# Patient Record
Sex: Male | Born: 1965 | Race: White | Hispanic: No | Marital: Single | Smoking: Current every day smoker
Health system: Southern US, Community
[De-identification: ages and names within clinical notes are randomized; demographics above are authoritative.]

## PROBLEM LIST (undated history)

## (undated) ENCOUNTER — Emergency Department (HOSPITAL_COMMUNITY): Admission: EM | Payer: Self-pay | Source: Home / Self Care

## (undated) DIAGNOSIS — R229 Localized swelling, mass and lump, unspecified: Secondary | ICD-10-CM

## (undated) DIAGNOSIS — K219 Gastro-esophageal reflux disease without esophagitis: Secondary | ICD-10-CM

## (undated) DIAGNOSIS — F32A Depression, unspecified: Secondary | ICD-10-CM

## (undated) DIAGNOSIS — M1990 Unspecified osteoarthritis, unspecified site: Secondary | ICD-10-CM

## (undated) DIAGNOSIS — F419 Anxiety disorder, unspecified: Secondary | ICD-10-CM

## (undated) DIAGNOSIS — IMO0002 Reserved for concepts with insufficient information to code with codable children: Secondary | ICD-10-CM

## (undated) DIAGNOSIS — F329 Major depressive disorder, single episode, unspecified: Secondary | ICD-10-CM

## (undated) DIAGNOSIS — S20419A Abrasion of unspecified back wall of thorax, initial encounter: Secondary | ICD-10-CM

## (undated) HISTORY — DX: Depression, unspecified: F32.A

## (undated) HISTORY — DX: Gastro-esophageal reflux disease without esophagitis: K21.9

## (undated) HISTORY — DX: Anxiety disorder, unspecified: F41.9

## (undated) HISTORY — DX: Major depressive disorder, single episode, unspecified: F32.9

---

## 2002-01-09 ENCOUNTER — Emergency Department (HOSPITAL_COMMUNITY): Admission: EM | Admit: 2002-01-09 | Discharge: 2002-01-10 | Payer: Self-pay | Admitting: Emergency Medicine

## 2002-01-10 ENCOUNTER — Emergency Department (HOSPITAL_COMMUNITY): Admission: EM | Admit: 2002-01-10 | Discharge: 2002-01-10 | Payer: Self-pay | Admitting: *Deleted

## 2002-01-10 ENCOUNTER — Encounter: Payer: Self-pay | Admitting: Emergency Medicine

## 2002-01-14 ENCOUNTER — Emergency Department (HOSPITAL_COMMUNITY): Admission: EM | Admit: 2002-01-14 | Discharge: 2002-01-14 | Payer: Self-pay | Admitting: Emergency Medicine

## 2002-01-23 ENCOUNTER — Emergency Department (HOSPITAL_COMMUNITY): Admission: EM | Admit: 2002-01-23 | Discharge: 2002-01-23 | Payer: Self-pay | Admitting: Emergency Medicine

## 2004-03-02 ENCOUNTER — Emergency Department (HOSPITAL_COMMUNITY): Admission: EM | Admit: 2004-03-02 | Discharge: 2004-03-02 | Payer: Self-pay | Admitting: Emergency Medicine

## 2005-02-02 ENCOUNTER — Emergency Department (HOSPITAL_COMMUNITY): Admission: EM | Admit: 2005-02-02 | Discharge: 2005-02-02 | Payer: Self-pay | Admitting: Emergency Medicine

## 2005-09-29 ENCOUNTER — Emergency Department (HOSPITAL_COMMUNITY): Admission: EM | Admit: 2005-09-29 | Discharge: 2005-09-29 | Payer: Self-pay | Admitting: Emergency Medicine

## 2006-04-02 ENCOUNTER — Emergency Department (HOSPITAL_COMMUNITY): Admission: EM | Admit: 2006-04-02 | Discharge: 2006-04-02 | Payer: Self-pay | Admitting: Emergency Medicine

## 2008-02-19 ENCOUNTER — Emergency Department (HOSPITAL_COMMUNITY): Admission: EM | Admit: 2008-02-19 | Discharge: 2008-02-20 | Payer: Self-pay | Admitting: Emergency Medicine

## 2009-02-02 ENCOUNTER — Emergency Department (HOSPITAL_COMMUNITY): Admission: EM | Admit: 2009-02-02 | Discharge: 2009-02-03 | Payer: Self-pay | Admitting: Emergency Medicine

## 2010-07-25 LAB — ACETAMINOPHEN LEVEL: Acetaminophen (Tylenol), Serum: 18.2 ug/mL (ref 10–30)

## 2010-07-25 LAB — COMPREHENSIVE METABOLIC PANEL
ALT: 16 U/L (ref 0–53)
CO2: 27 mEq/L (ref 19–32)
Calcium: 8.9 mg/dL (ref 8.4–10.5)
Creatinine, Ser: 0.82 mg/dL (ref 0.4–1.5)
GFR calc non Af Amer: >60 mL/min (ref >60)
Glucose, Bld: 76 mg/dL (ref 70–99)

## 2010-07-25 LAB — DIFFERENTIAL
Basophils Absolute: 0.1 10*3/uL (ref 0.0–0.1)
Basophils Relative: 1 % (ref 0–1)
Lymphocytes Relative: 39 % (ref 12–46)
Monocytes Absolute: 0.5 10*3/uL (ref 0.1–1.0)
Neutro Abs: 3.4 10*3/uL (ref 1.7–7.7)
Neutrophils Relative %: 48 % (ref 43–77)

## 2010-07-25 LAB — RAPID URINE DRUG SCREEN, HOSP PERFORMED
Cocaine: POSITIVE — AB
Opiates: POSITIVE — AB

## 2010-07-25 LAB — CBC
HCT: 33.3 % — ABNORMAL LOW (ref 39.0–52.0)
Hemoglobin: 11.4 g/dL — ABNORMAL LOW (ref 13.0–17.0)
MCHC: 34.4 g/dL (ref 30.0–36.0)
MCV: 92 fL (ref 78.0–100.0)
RBC: 3.62 MIL/uL — ABNORMAL LOW (ref 4.22–5.81)

## 2010-07-25 LAB — SALICYLATE LEVEL: Salicylate Lvl: <4 mg/dL (ref 2.8–20.0)

## 2014-01-05 ENCOUNTER — Emergency Department (HOSPITAL_COMMUNITY)
Admission: EM | Admit: 2014-01-05 | Discharge: 2014-01-06 | Disposition: A | Discharge status: Home / Self Care | Payer: Self-pay | Attending: Emergency Medicine | Admitting: Emergency Medicine

## 2014-01-05 DIAGNOSIS — Z046 Encounter for general psychiatric examination, requested by authority: Secondary | ICD-10-CM | POA: Insufficient documentation

## 2014-01-05 DIAGNOSIS — F1910 Other psychoactive substance abuse, uncomplicated: Secondary | ICD-10-CM

## 2014-01-05 DIAGNOSIS — F1310 Sedative, hypnotic or anxiolytic abuse, uncomplicated: Secondary | ICD-10-CM | POA: Insufficient documentation

## 2014-01-05 HISTORY — DX: Abrasion of unspecified back wall of thorax, initial encounter: S20.419A
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
## 2014-01-05 NOTE — ED Provider Notes (Signed)
CSN: [id number]     Arrival date & time 01/05/14  2348 History  This chart was scribed for No att. providers found by Tonye Royalty, ED Scribe. This patient was seen in room Room/bed info not found and the patient's care was started at 11:52 PM.    No chief complaint on file.  Patient is a 48 y.o. male presenting with altered mental status. The history is provided by the police (the police brought the pt to the er because he was confused and lethargic). No language interpreter was used.  Altered Mental Status Presenting symptoms: lethargy   Severity:  Moderate Most recent episode:  Today Episode history:  Single Timing:  Constant Progression:  Waxing and waning Chronicity:  Recurrent Context: not alcohol use and not dementia   Associated symptoms: no abdominal pain, no hallucinations, no headaches, no rash and no seizures     HPI Comments: Gene Smith is a 48 y.o. male who presents to the Emergency Department complaining of lethargy  No past medical history on file. No past surgical history on file. No family history on file. History  Substance Use Topics  . Smoking status: Not on file  . Smokeless tobacco: Not on file  . Alcohol Use: Not on file    Review of Systems  Constitutional: Negative for appetite change and fatigue.  HENT: Negative for congestion, ear discharge and sinus pressure.   Eyes: Negative for discharge.  Respiratory: Negative for cough.   Cardiovascular: Negative for chest pain.  Gastrointestinal: Negative for abdominal pain and diarrhea.  Genitourinary: Negative for frequency and hematuria.  Musculoskeletal: Negative for back pain.  Skin: Negative for rash.  Neurological: Negative for seizures and headaches.  Psychiatric/Behavioral: Negative for hallucinations.      Allergies  Review of patient's allergies indicates not on file.  Home Medications   Prior to Admission medications   Not on File   There were no vitals taken for this  visit. Physical Exam  Nursing note and vitals reviewed. Constitutional: He is oriented to person, place, and time. He appears well-developed.  HENT:  Head: Normocephalic.  Eyes: Conjunctivae and EOM are normal. No scleral icterus.  Neck: Neck supple. No thyromegaly present.  Cardiovascular: Normal rate and regular rhythm.  Exam reveals no gallop and no friction rub.   No murmur heard. Pulmonary/Chest: No stridor. He has no wheezes. He has no rales. He exhibits no tenderness.  Abdominal: He exhibits no distension. There is no tenderness. There is no rebound.  Musculoskeletal: Normal range of motion. He exhibits no edema.  Lymphadenopathy:    He has no cervical adenopathy.  Neurological: He is oriented to person, place, and time. He exhibits normal muscle tone. Coordination normal.  Skin: No rash noted. No erythema.  Psychiatric: He has a normal mood and affect. His behavior is normal.    ED Course  Procedures (including critical care time) Labs Review Labs Reviewed - No data to display  Imaging Review No results found.   EKG Interpretation None     DIAGNOSTIC STUDIES:   COORDINATION OF CARE:    MDM   Final diagnoses:  None  pt not homicidal or suicidal.   Is able to walk and is o x 4.   Will d/c home  The chart was scribed for me under my direct supervision.  I personally performed the history, physical, and medical decision making and all procedures in the evaluation of this patient.Benny Lennert, MD 01/06/14 [phone]

## 2014-01-06 ENCOUNTER — Emergency Department (HOSPITAL_COMMUNITY)
Admission: EM | Admit: 2014-01-06 | Discharge: 2014-01-06 | Disposition: A | Discharge status: Home / Self Care | Payer: Self-pay | Attending: Emergency Medicine | Admitting: Emergency Medicine

## 2014-01-06 ENCOUNTER — Encounter (HOSPITAL_COMMUNITY): Payer: Self-pay | Admitting: Emergency Medicine

## 2014-01-06 DIAGNOSIS — Z8739 Personal history of other diseases of the musculoskeletal system and connective tissue: Secondary | ICD-10-CM | POA: Insufficient documentation

## 2014-01-06 DIAGNOSIS — F1910 Other psychoactive substance abuse, uncomplicated: Secondary | ICD-10-CM | POA: Insufficient documentation

## 2014-01-06 DIAGNOSIS — Z87828 Personal history of other (healed) physical injury and trauma: Secondary | ICD-10-CM | POA: Insufficient documentation

## 2014-01-06 DIAGNOSIS — Z79899 Other long term (current) drug therapy: Secondary | ICD-10-CM | POA: Insufficient documentation

## 2014-01-06 DIAGNOSIS — Z046 Encounter for general psychiatric examination, requested by authority: Secondary | ICD-10-CM | POA: Insufficient documentation

## 2014-01-06 DIAGNOSIS — F17200 Nicotine dependence, unspecified, uncomplicated: Secondary | ICD-10-CM | POA: Insufficient documentation

## 2014-01-06 HISTORY — DX: Unspecified osteoarthritis, unspecified site: M19.90

## 2014-01-06 LAB — COMPREHENSIVE METABOLIC PANEL
ALBUMIN: 3.7 g/dL (ref 3.5–5.2)
ALK PHOS: 69 U/L (ref 39–117)
ALK PHOS: 71 U/L (ref 39–117)
ALT: 39 U/L (ref 0–53)
ALT: 39 U/L (ref 0–53)
ANION GAP: 11 (ref 5–15)
ANION GAP: 11 (ref 5–15)
AST: 50 U/L — ABNORMAL HIGH (ref 0–37)
AST: 48 U/L — ABNORMAL HIGH (ref 0–37)
Albumin: 3.9 g/dL (ref 3.5–5.2)
BILIRUBIN TOTAL: 0.2 mg/dL — AB (ref 0.3–1.2)
BILIRUBIN TOTAL: 0.2 mg/dL — AB (ref 0.3–1.2)
BUN: 11 mg/dL (ref 6–23)
BUN: 13 mg/dL (ref 6–23)
CHLORIDE: 102 meq/L (ref 96–112)
CHLORIDE: 103 meq/L (ref 96–112)
CO2: 27 mEq/L (ref 19–32)
CO2: 29 meq/L (ref 19–32)
CREATININE: 0.71 mg/dL (ref 0.50–1.35)
Calcium: 8.7 mg/dL (ref 8.4–10.5)
Calcium: 9.1 mg/dL (ref 8.4–10.5)
Creatinine, Ser: 0.85 mg/dL (ref 0.50–1.35)
GFR calc non Af Amer: >90 mL/min (ref >90)
GFR calc non Af Amer: >90 mL/min (ref >90)
GLUCOSE: 93 mg/dL (ref 70–99)
GLUCOSE: 99 mg/dL (ref 70–99)
POTASSIUM: 4.8 meq/L (ref 3.7–5.3)
Potassium: 3.6 mEq/L — ABNORMAL LOW (ref 3.7–5.3)
SODIUM: 142 meq/L (ref 137–147)
Sodium: 141 mEq/L (ref 137–147)
TOTAL PROTEIN: 7.6 g/dL (ref 6.0–8.3)
Total Protein: 7.2 g/dL (ref 6.0–8.3)

## 2014-01-06 LAB — RAPID URINE DRUG SCREEN, HOSP PERFORMED
AMPHETAMINES: NOT DETECTED
AMPHETAMINES: NOT DETECTED
BARBITURATES: NOT DETECTED
Barbiturates: NOT DETECTED
Benzodiazepines: POSITIVE — AB
Benzodiazepines: POSITIVE — AB
Cocaine: NOT DETECTED
Cocaine: NOT DETECTED
OPIATES: NOT DETECTED
OPIATES: NOT DETECTED
TETRAHYDROCANNABINOL: NOT DETECTED
Tetrahydrocannabinol: NOT DETECTED

## 2014-01-06 LAB — CBC
HEMATOCRIT: 35 % — AB (ref 39.0–52.0)
HEMOGLOBIN: 12.2 g/dL — AB (ref 13.0–17.0)
MCH: 30.8 pg (ref 26.0–34.0)
MCHC: 34.9 g/dL (ref 30.0–36.0)
MCV: 88.4 fL (ref 78.0–100.0)
Platelets: 172 10*3/uL (ref 150–400)
RBC: 3.96 MIL/uL — AB (ref 4.22–5.81)
RDW: 13.5 % (ref 11.5–15.5)
WBC: 5.8 10*3/uL (ref 4.0–10.5)

## 2014-01-06 LAB — CBC WITH DIFFERENTIAL/PLATELET
BASOS PCT: 0 % (ref 0–1)
Basophils Absolute: 0 10*3/uL (ref 0.0–0.1)
EOS ABS: 0.3 10*3/uL (ref 0.0–0.7)
Eosinophils Relative: 5 % (ref 0–5)
HEMATOCRIT: 33.3 % — AB (ref 39.0–52.0)
HEMOGLOBIN: 11.8 g/dL — AB (ref 13.0–17.0)
LYMPHS ABS: 2.8 10*3/uL (ref 0.7–4.0)
Lymphocytes Relative: 41 % (ref 12–46)
MCH: 31.2 pg (ref 26.0–34.0)
MCHC: 35.4 g/dL (ref 30.0–36.0)
MCV: 88.1 fL (ref 78.0–100.0)
MONO ABS: 0.5 10*3/uL (ref 0.1–1.0)
MONOS PCT: 7 % (ref 3–12)
NEUTROS ABS: 3.3 10*3/uL (ref 1.7–7.7)
Neutrophils Relative %: 47 % (ref 43–77)
Platelets: 200 10*3/uL (ref 150–400)
RBC: 3.78 MIL/uL — AB (ref 4.22–5.81)
RDW: 13.6 % (ref 11.5–15.5)
WBC: 7 10*3/uL (ref 4.0–10.5)

## 2014-01-06 LAB — ACETAMINOPHEN LEVEL

## 2014-01-06 LAB — ETHANOL: Alcohol, Ethyl (B): <11 mg/dL (ref 0–11)

## 2014-01-06 LAB — SALICYLATE LEVEL: Salicylate Lvl: <2 mg/dL — ABNORMAL LOW (ref 2.8–20.0)

## 2014-01-06 MED ORDER — ACETAMINOPHEN 500 MG PO TABS
1000.0000 mg | ORAL_TABLET | Freq: Once | ORAL | Status: AC
  Administered 2014-01-06: 1000 mg via ORAL
  Filled 2014-01-06: qty 2

## 2014-01-06 NOTE — ED Notes (Signed)
Pt. C/o tooth pain. Dr. Hyacinth Meeker notified.

## 2014-01-06 NOTE — BH Assessment (Signed)
Per Dr. Hyacinth Meeker pt has been committed by his mother. Mother sts pt abuses his prescriptions, "fall down drunk." Per mother pt makes statements about wanting to hurt her. Pt denies this and drug use.  Pt is medically cleared.   TA to commence shortly.  Clista Bernhardt, [facility] Triage Specialist 01/06/2014 7:30 PM

## 2014-01-06 NOTE — Discharge Instructions (Signed)
Please call your doctor for a followup appointment within 24-48 hours. When you talk to your doctor please let them know that you were seen in the emergency department and have them acquire all of your records so that they can discuss the findings with you and formulate a treatment plan to fully care for your new and ongoing problems. ° °

## 2014-01-06 NOTE — BH Assessment (Addendum)
Spoke with mother to obtain collateral information. Per mom: "My son has a very bad drug problem." Mom reports she is stressed out and her "nerves are shot." She worries pt is going to kill himself. Pt has made comments about overdosing but that has been "Awhile ago." Mother reports pt made comment about wanting to hurt her the other day, then went up to his room. Mom reports pt yells loudly. PT has not acted on any threats or been violent. Has passed out on the floor a couple of times, the last 5 days with medication increase. Mom reports pt has expressed desire for tx.  Requested RN to clarify with pt if he is requesting treatment. Pt is not expressing desire for tx. Reports he needs medication to function and does not abuse it.   Clista Bernhardt, [facility] Triage Specialist 01/06/2014 8:38 PM

## 2014-01-06 NOTE — ED Notes (Signed)
Patient's mother requesting to see patient. Patient agreeable to see mother. Dr Hyacinth Meeker stated mother could visit patient for 5 minutes with supervision. Tammy NT to stay in room with patient and mother.

## 2014-01-06 NOTE — ED Provider Notes (Signed)
CSN: [id number]     Arrival date & time 01/06/14  1738 History   First MD Initiated Contact with Patient 01/06/14 1751     Chief Complaint  Patient presents with  . V70.1     (Consider location/radiation/quality/duration/timing/severity/associated sxs/prior Treatment) HPI Comments: 48 year old male, history of drug abuse in the form of prescription opiate medications. He also uses Xanax. His mother filled out involuntary commitment papers on him today and the patient was transported to the hospital by the police. According to the police the patient has been completely cooperative and compliant with their requests and has shown no violent or disruptive behavior. I have spoken with Ms. Lilyan Punt, the patient's mother, who states that the patient has had drug seeking behavior, she feels threatened when he states things like "I want to hurt U." but she states this is in relation to him wanting to get more opiate medications and her having confrontation with him regarding this matter. She denies any suicidal comments, homicidal comments and agrees that he has not had any violent behavior in the past. She does state that when he is high on medications he will be very messy in the house and can't "trash" a room. The police officers have had many interactions with his family and state that the patient has never had any violent outbursts or behavior. The patient denies these claims and states that he does use Percocet chronically for his chronic pain but denies abusing this medication. He denies alcohol abuse and denies any other illegal drug abuse.  The history is provided by the patient.    Past Medical History  Diagnosis Date  . Back abrasion   . Arthritis    History reviewed. No pertinent past surgical history. History reviewed. No pertinent family history. History  Substance Use Topics  . Smoking status: Current Every Day Smoker -- 1.00 packs/day  . Smokeless tobacco: Not on file  . Alcohol  Use: No    Review of Systems  All other systems reviewed and are negative.     Allergies  Review of patient's allergies indicates no known allergies.  Home Medications   Prior to Admission medications   Medication Sig Start Date End Date Taking? Authorizing Provider  ALPRAZolam Prudy Feeler) 1 MG tablet Take 1 mg by mouth every 4 (four) hours as needed for anxiety.   Yes Historical Provider, MD  oxyCODONE (ROXICODONE) 15 MG immediate release tablet Take 15 mg by mouth every 6 (six) hours as needed for pain.   Yes Historical Provider, MD   BP 117/77  Pulse 79  Temp(Src) 98.7 F (37.1 C) (Oral)  Resp 18  Ht  (1.778 m)  Wt 190 lb (86.183 kg)  BMI 27.26 kg/m2  SpO2 95% Physical Exam  Nursing note and vitals reviewed. Constitutional: He appears well-developed and well-nourished. No distress.  HENT:  Head: Normocephalic and atraumatic.  Mouth/Throat: Oropharynx is clear and moist. No oropharyngeal exudate.  Eyes: Conjunctivae and EOM are normal. Pupils are equal, round, and reactive to light. Right eye exhibits no discharge. Left eye exhibits no discharge. No scleral icterus.  Neck: Normal range of motion. Neck supple. No JVD present. No thyromegaly present.  Cardiovascular: Normal rate, regular rhythm, normal heart sounds and intact distal pulses.  Exam reveals no gallop and no friction rub.   No murmur heard. Pulmonary/Chest: Effort normal and breath sounds normal. No respiratory distress. He has no wheezes. He has no rales.  Abdominal: Soft. Bowel sounds are normal. He exhibits no  distension and no mass. There is no tenderness.  Musculoskeletal: Normal range of motion. He exhibits no edema and no tenderness.  Lymphadenopathy:    He has no cervical adenopathy.  Neurological: He is alert. Coordination normal.  Skin: Skin is warm and dry. No rash noted. No erythema.  Psychiatric: He has a normal mood and affect. His behavior is normal.  Normal affect, no suicidal thoughts, no  hallucinations    ED Course  Procedures (including critical care time) Labs Review Labs Reviewed  CBC - Abnormal; Notable for the following:    RBC 3.96 (*)    Hemoglobin 12.2 (*)    HCT 35.0 (*)    All other components within normal limits  COMPREHENSIVE METABOLIC PANEL - Abnormal; Notable for the following:    AST 48 (*)    Total Bilirubin 0.2 (*)    All other components within normal limits  SALICYLATE LEVEL - Abnormal; Notable for the following:    Salicylate Lvl <2.0 (*)    All other components within normal limits  URINE RAPID DRUG SCREEN (HOSP PERFORMED) - Abnormal; Notable for the following:    Benzodiazepines POSITIVE (*)    All other components within normal limits  ACETAMINOPHEN LEVEL  ETHANOL    Imaging Review No results found.    MDM   Final diagnoses:  Substance abuse    At this time the patient has normal vital signs and a normal physical exam, he does not appear to be in withdrawal or distress and has no response to internal stimuli. I will discuss his care with a psychiatric evaluation team to help determine whether the patient needs to continue being under involuntary commitment or whether he can be discharged. Of note the patient was seen within the last 24 hours for similar complaint under involuntary commitment and was discharged as he was deemed not a threat to himself or others at that time. The patient states that he went home early in the morning, he knew that his mother would be asleep in the house and went to sleep, when she awoke in the morning and found him in the house she became very upset again.  Pt has been seen by psych, - cleared as not SI / HI - pt's mother in agreement.  Will d/c hom, overturned IVC  Vida Roller, MD 01/06/14 2106

## 2014-01-06 NOTE — Discharge Instructions (Signed)
Follow up as needed

## 2014-01-06 NOTE — ED Notes (Signed)
Pt. Denies SI/HI. Pt. Reports taking hydrocodone and reports that he is unable to work without taking them. Pt. Denies wanting detox. Pt. Reports that mother's sister is the one who convinced his mother to take IVC papers on him and that she is lying. Pt. Stating "there is not a violent bone in my body, I love my Mom. I would never want to hurt her."

## 2014-01-06 NOTE — ED Notes (Signed)
Pt alert & oriented x4, stable gait. Patient  given discharge instructions, paperwork & prescription(s). Patient verbalized understanding. Pt left department w/ no further questions. 

## 2014-01-06 NOTE — ED Notes (Signed)
Pt says he needs help. Pt admits to taking meds tonight but denies taking them with the intent to hurt himself. Pt is unable to stay awake & keeps falling to sleep. Pt can be woke up by speaking to him. [employer] in room w/ pt.
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
## 2014-01-06 NOTE — ED Notes (Signed)
Pt belongings placed in bag & locked in lockers.
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
## 2014-12-26 ENCOUNTER — Emergency Department (HOSPITAL_COMMUNITY): Payer: Self-pay

## 2014-12-26 ENCOUNTER — Emergency Department (HOSPITAL_COMMUNITY)
Admission: EM | Admit: 2014-12-26 | Discharge: 2014-12-26 | Disposition: A | Discharge status: Home / Self Care | Payer: Self-pay | Attending: Emergency Medicine | Admitting: Emergency Medicine

## 2014-12-26 ENCOUNTER — Encounter (HOSPITAL_COMMUNITY): Payer: Self-pay

## 2014-12-26 DIAGNOSIS — Z87828 Personal history of other (healed) physical injury and trauma: Secondary | ICD-10-CM | POA: Insufficient documentation

## 2014-12-26 DIAGNOSIS — M1990 Unspecified osteoarthritis, unspecified site: Secondary | ICD-10-CM | POA: Insufficient documentation

## 2014-12-26 DIAGNOSIS — Z720 Tobacco use: Secondary | ICD-10-CM | POA: Insufficient documentation

## 2014-12-26 DIAGNOSIS — R0789 Other chest pain: Secondary | ICD-10-CM | POA: Insufficient documentation

## 2014-12-26 LAB — COMPREHENSIVE METABOLIC PANEL
ALBUMIN: 3.6 g/dL (ref 3.5–5.0)
ALT: 11 U/L — ABNORMAL LOW (ref 17–63)
AST: 17 U/L (ref 15–41)
Alkaline Phosphatase: 74 U/L (ref 38–126)
Anion gap: 10 (ref 5–15)
BUN: 14 mg/dL (ref 6–20)
CALCIUM: 8.5 mg/dL — AB (ref 8.9–10.3)
CO2: 26 mmol/L (ref 22–32)
CREATININE: 0.59 mg/dL — AB (ref 0.61–1.24)
Chloride: 94 mmol/L — ABNORMAL LOW (ref 101–111)
Glucose, Bld: 112 mg/dL — ABNORMAL HIGH (ref 65–99)
Potassium: 3.7 mmol/L (ref 3.5–5.1)
SODIUM: 130 mmol/L — AB (ref 135–145)
Total Bilirubin: 0.3 mg/dL (ref 0.3–1.2)
Total Protein: 8.6 g/dL — ABNORMAL HIGH (ref 6.5–8.1)

## 2014-12-26 LAB — CBC WITH DIFFERENTIAL/PLATELET
BASOS ABS: 0.1 10*3/uL (ref 0.0–0.1)
BASOS PCT: 1 % (ref 0–1)
EOS ABS: 0.3 10*3/uL (ref 0.0–0.7)
EOS PCT: 3 % (ref 0–5)
HEMATOCRIT: 33.9 % — AB (ref 39.0–52.0)
Hemoglobin: 11.7 g/dL — ABNORMAL LOW (ref 13.0–17.0)
Lymphocytes Relative: 22 % (ref 12–46)
Lymphs Abs: 2.5 10*3/uL (ref 0.7–4.0)
MCH: 28.1 pg (ref 26.0–34.0)
MCHC: 34.5 g/dL (ref 30.0–36.0)
MCV: 81.5 fL (ref 78.0–100.0)
MONO ABS: 0.8 10*3/uL (ref 0.1–1.0)
MONOS PCT: 7 % (ref 3–12)
Neutro Abs: 7.8 10*3/uL — ABNORMAL HIGH (ref 1.7–7.7)
Neutrophils Relative %: 67 % (ref 43–77)
PLATELETS: 437 10*3/uL — AB (ref 150–400)
RBC: 4.16 MIL/uL — ABNORMAL LOW (ref 4.22–5.81)
RDW: 14.1 % (ref 11.5–15.5)
WBC: 11.5 10*3/uL — ABNORMAL HIGH (ref 4.0–10.5)

## 2014-12-26 MED ORDER — OXYCODONE-ACETAMINOPHEN 5-325 MG PO TABS
1.0000 | ORAL_TABLET | Freq: Once | ORAL | Status: AC
  Administered 2014-12-26: 1 via ORAL
  Filled 2014-12-26: qty 1

## 2014-12-26 MED ORDER — TRAMADOL HCL 50 MG PO TABS: 50.0000 mg | ORAL_TABLET | Freq: Four times a day (QID) | ORAL | Status: DC | PRN

## 2014-12-26 MED ORDER — IOHEXOL 300 MG/ML  SOLN
75.0000 mL | Freq: Once | INTRAMUSCULAR | Status: AC | PRN
  Administered 2014-12-26: 75 mL via INTRAVENOUS

## 2014-12-26 NOTE — ED Notes (Signed)
Pt c/o pain and swelling over left ribs.  Pt says has had pain in this area  off and on for 8 months  But has never had the sweling.  Reports usually goes away after a week or so.  Pt says this episode has lasted for 2 weeks.

## 2014-12-26 NOTE — Discharge Instructions (Signed)
You should get a call from [place] about when they will do your biopsy.   Get yourself a family md so someone can follow this chest problem and follow up on the biopsy results

## 2014-12-26 NOTE — ED Provider Notes (Signed)
CSN: [id number]     Arrival date & time 12/26/14  7829 History   First MD Initiated Contact with Patient 12/26/14 [phone]     Chief Complaint  Patient presents with  . Abdominal Pain     (Consider location/radiation/quality/duration/timing/severity/associated sxs/prior Treatment) Patient is a 49 y.o. male presenting with chest pain. The history is provided by the patient (The patient states he has had for a few weeks a swelling to his left lower chest. Mild pain with this.).  Chest Pain Pain location:  L chest Pain quality: aching   Pain radiates to:  Does not radiate Pain radiates to the back: no   Pain severity:  Mild Onset quality:  Gradual Timing:  Constant Progression:  Unchanged Chronicity:  New Context: not breathing   Associated symptoms: no abdominal pain, no back pain, no cough, no fatigue and no headache     Past Medical History  Diagnosis Date  . Back abrasion   . Arthritis    History reviewed. No pertinent past surgical history. No family history on file. Social History  Substance Use Topics  . Smoking status: Current Every Day Smoker -- 1.00 packs/day  . Smokeless tobacco: None  . Alcohol Use: No    Review of Systems  Constitutional: Negative for appetite change and fatigue.  HENT: Negative for congestion, ear discharge and sinus pressure.   Eyes: Negative for discharge.  Respiratory: Negative for cough.   Cardiovascular: Positive for chest pain.  Gastrointestinal: Negative for abdominal pain and diarrhea.  Genitourinary: Negative for frequency and hematuria.  Musculoskeletal: Negative for back pain.  Skin: Negative for rash.  Neurological: Negative for seizures and headaches.  Psychiatric/Behavioral: Negative for hallucinations.      Allergies  Review of patient's allergies indicates no known allergies.  Home Medications   Prior to Admission medications   Medication Sig Start Date End Date Taking? Authorizing Provider  acetaminophen (TYLENOL)  500 MG tablet Take 1,000 mg by mouth every 6 (six) hours as needed for moderate pain.   Yes Historical Provider, MD  ibuprofen (ADVIL,MOTRIN) 200 MG tablet Take 400 mg by mouth every 6 (six) hours as needed for moderate pain.   Yes Historical Provider, MD  naproxen sodium (ANAPROX) 220 MG tablet Take 440 mg by mouth daily as needed (pain).   Yes Historical Provider, MD  traMADol (ULTRAM) 50 MG tablet Take 1 tablet (50 mg total) by mouth every 6 (six) hours as needed. 12/26/14   Bethann Berkshire, MD   BP 118/77 mmHg  Pulse 66  Temp(Src) 97.8 F (36.6 C) (Oral)  Resp 11  Ht 5\' 10"  (1.778 m)  Wt 180 lb (81.647 kg)  BMI 25.83 kg/m2  SpO2 94% Physical Exam  Constitutional: He is oriented to person, place, and time. He appears well-developed.  HENT:  Head: Normocephalic.  Eyes: Conjunctivae and EOM are normal. No scleral icterus.  Neck: Neck supple. No thyromegaly present.  Cardiovascular: Normal rate and regular rhythm.  Exam reveals no gallop and no friction rub.   No murmur heard. Pulmonary/Chest: No stridor. He has no wheezes. He has no rales. He exhibits tenderness.  Mass left lower chest  Abdominal: He exhibits no distension. There is no tenderness. There is no rebound.  Musculoskeletal: Normal range of motion. He exhibits no edema.  Lymphadenopathy:    He has no cervical adenopathy.  Neurological: He is oriented to person, place, and time. He exhibits normal muscle tone. Coordination normal.  Skin: No rash noted. No erythema.  Psychiatric: He  has a normal mood and affect. His behavior is normal.    ED Course  Procedures (including critical care time) Labs Review Labs Reviewed  CBC WITH DIFFERENTIAL/PLATELET - Abnormal; Notable for the following:    WBC 11.5 (*)    RBC 4.16 (*)    Hemoglobin 11.7 (*)    HCT 33.9 (*)    Platelets 437 (*)    Neutro Abs 7.8 (*)    All other components within normal limits  COMPREHENSIVE METABOLIC PANEL - Abnormal; Notable for the following:     Sodium 130 (*)    Chloride 94 (*)    Glucose, Bld 112 (*)    Creatinine, Ser 0.59 (*)    Calcium 8.5 (*)    Total Protein 8.6 (*)    ALT 11 (*)    All other components within normal limits    Imaging Review Dg Ribs Unilateral W/chest Left  12/26/2014   CLINICAL DATA:  Left lower rib pain for 6 months with worsening last 2 weeks  EXAM: LEFT RIBS AND CHEST - 3+ VIEW  COMPARISON:  None.  FINDINGS: Three views left ribs submitted. No rib fracture is identified. No destructive bony lesion. There is no pneumothorax.  IMPRESSION: Negative.   Electronically Signed   By: Natasha Mead M.D.   On: 12/26/2014 08:18   Ct Chest W Contrast  12/26/2014   CLINICAL DATA:  Swelling about the anterior aspect of left ribs for 6 months which has worsened over the past 2 weeks. No known injury. Initial encounter.  EXAM: CT CHEST WITH CONTRAST  TECHNIQUE: Multidetector CT imaging of the chest was performed during intravenous contrast administration.  CONTRAST:  75 mL OMNIPAQUE IOHEXOL 300 MG/ML  SOLN  COMPARISON:  Plain films of the chest and left ribs this same day.  FINDINGS: There is a soft tissue attenuation lesion in the anterior aspect of the left lower chest in the region of concern centered about the anterior arc of the left eighth rib which measures approximately 5.0 cm transverse by 8.6 cm oblique AP by 10.0 cm craniocaudal. The lesion appears to arise from intercostal musculature and has a component which extends deep to the diaphragm into the abdomen. No bony destructive change is seen. There is no associated soft tissue calcification or fluid collection. There is subcutaneous edema a along the lower left chest and upper left flank.  Calcific coronary atherosclerosis is identified heart size is normal. No pleural or pericardial effusion. No axillary, hilar or mediastinal lymphadenopathy. The lungs are clear.  Imaged intra-abdominal visceral is unremarkable. No focal bony abnormality is identified.  IMPRESSION:  Nonspecific soft tissue lesion in the lower left chest wall in the region of concern could be secondary to a benign process such as fibromatosis but malignant lesions including undifferentiated sarcoma are also within the differential. Biopsy is recommended for further evaluation.   Electronically Signed   By: Drusilla Kanner M.D.   On: 12/26/2014 12:37   I have personally reviewed and evaluated these images and lab results as part of my medical decision-making.   EKG Interpretation None      MDM   Final diagnoses:  Acute chest wall pain    CT scan shows mass in the left chest wall. The patient is scheduled to get a biopsy of his chest mass. He will follow-up with a family doctor for the results. Patient given prescription of Ultram    Bethann Berkshire, MD 12/26/14 1433

## 2015-01-05 ENCOUNTER — Other Ambulatory Visit: Payer: Self-pay | Admitting: Radiology

## 2015-01-05 ENCOUNTER — Emergency Department (HOSPITAL_COMMUNITY): Payer: Self-pay

## 2015-01-05 ENCOUNTER — Encounter (HOSPITAL_COMMUNITY): Payer: Self-pay | Admitting: Emergency Medicine

## 2015-01-05 ENCOUNTER — Emergency Department (HOSPITAL_COMMUNITY)
Admission: EM | Admit: 2015-01-05 | Discharge: 2015-01-05 | Disposition: A | Discharge status: Home / Self Care | Payer: Self-pay | Attending: Physician Assistant | Admitting: Physician Assistant

## 2015-01-05 DIAGNOSIS — Z720 Tobacco use: Secondary | ICD-10-CM | POA: Insufficient documentation

## 2015-01-05 DIAGNOSIS — M1990 Unspecified osteoarthritis, unspecified site: Secondary | ICD-10-CM | POA: Insufficient documentation

## 2015-01-05 DIAGNOSIS — Z760 Encounter for issue of repeat prescription: Secondary | ICD-10-CM | POA: Insufficient documentation

## 2015-01-05 DIAGNOSIS — Z87828 Personal history of other (healed) physical injury and trauma: Secondary | ICD-10-CM | POA: Insufficient documentation

## 2015-01-05 DIAGNOSIS — R0789 Other chest pain: Secondary | ICD-10-CM | POA: Insufficient documentation

## 2015-01-05 HISTORY — DX: Localized swelling, mass and lump, unspecified: R22.9

## 2015-01-05 HISTORY — DX: Reserved for concepts with insufficient information to code with codable children: IMO0002

## 2015-01-05 MED ORDER — TRAMADOL HCL 50 MG PO TABS
100.0000 mg | ORAL_TABLET | Freq: Once | ORAL | Status: AC
  Administered 2015-01-05: 100 mg via ORAL
  Filled 2015-01-05: qty 2

## 2015-01-05 MED ORDER — TRAMADOL HCL 50 MG PO TABS
50.0000 mg | ORAL_TABLET | Freq: Four times a day (QID) | ORAL | Status: DC | PRN
Start: 2015-01-05 — End: 2015-02-06

## 2015-01-05 NOTE — Discharge Instructions (Signed)
Please go to [facility] to get your biopsy on Monday.  Please return to the ED with any fevers or spread of the redness on your chest.  Use the pain medications as needed and return with any change in symptoms.  Chest Wall Pain Chest wall pain is pain in or around the bones and muscles of your chest. It may take up to 6 weeks to get better. It may take longer if you must stay physically active in your work and activities.  CAUSES  Chest wall pain may happen on its own. However, it may be caused by:  A viral illness like the flu.  Injury.  Coughing.  Exercise.  Arthritis.  Fibromyalgia.  Shingles. HOME CARE INSTRUCTIONS   Avoid overtiring physical activity. Try not to strain or perform activities that cause pain. This includes any activities using your chest or your abdominal and side muscles, especially if heavy weights are used.  Put ice on the sore area.  Put ice in a plastic bag.  Place a towel between your skin and the bag.  Leave the ice on for 15-20 minutes per hour while awake for the first 2 days.  Only take over-the-counter or prescription medicines for pain, discomfort, or fever as directed by your caregiver. SEEK IMMEDIATE MEDICAL CARE IF:   Your pain increases, or you are very uncomfortable.  You have a fever.  Your chest pain becomes worse.  You have new, unexplained symptoms.  You have nausea or vomiting.  You feel sweaty or lightheaded.  You have a cough with phlegm (sputum), or you cough up blood. MAKE SURE YOU:   Understand these instructions.  Will watch your condition.  Will get help right away if you are not doing well or get worse. Document Released: 04/07/2005 Document Revised: 06/30/2011 Document Reviewed: 12/02/2010 [facility] Patient Information 2015 [place], [place]. This information is not intended to replace advice given to you by your health care provider. Make sure you discuss any questions you have with your health care provider.

## 2015-01-05 NOTE — ED Notes (Signed)
Pt states he is having pain just below L. Ribcage area. States that he has a mass there and is scheduled to have a biopsy on Monday. States he was told by his PCP that he was to come to ED if he had anymore pain.

## 2015-01-05 NOTE — ED Provider Notes (Signed)
CSN: [id number]     Arrival date & time 01/05/15  1628 History   First MD Initiated Contact with Patient 01/05/15 1649     Chief Complaint  Patient presents with  . Abdominal Pain     (Consider location/radiation/quality/duration/timing/severity/associated sxs/prior Treatment) HPI   Patient is a 49 year old male presenting with pain in the left mass on his chest wall. Patient had this mass diagnosed 2 weeks ago in the emergency room. He has a biopsy scheduled for Monday. Patient ran out of his Ultram that was prescribed to him on the last visit. Patient has been eating and drinking normally. Patient reports no fevers. No shortness of breath. He is just here to have more Ultram so that he can make it to his biopsy on Monday.  Past Medical History  Diagnosis Date  . Back abrasion   . Arthritis   . Mass    History reviewed. No pertinent past surgical history. History reviewed. No pertinent family history. Social History  Substance Use Topics  . Smoking status: Current Every Day Smoker -- 1.00 packs/day  . Smokeless tobacco: None  . Alcohol Use: No    Review of Systems  Constitutional: Negative for fever, activity change, fatigue and unexpected weight change.  HENT: Negative for congestion and hearing loss.   Eyes: Negative for discharge and redness.  Respiratory: Negative for cough and shortness of breath.   Cardiovascular:       Wall pain  Gastrointestinal: Negative for vomiting and abdominal pain.  Genitourinary: Negative for dysuria and urgency.  Musculoskeletal: Negative for arthralgias.  Allergic/Immunologic: Negative for immunocompromised state.  Neurological: Negative for headaches.  Psychiatric/Behavioral: Negative for behavioral problems and agitation.  All other systems reviewed and are negative.     Allergies  Review of patient's allergies indicates no known allergies.  Home Medications   Prior to Admission medications   Medication Sig Start Date End Date  Taking? Authorizing Provider  acetaminophen (TYLENOL) 500 MG tablet Take 1,000 mg by mouth every 6 (six) hours as needed for moderate pain.    Historical Provider, MD  ibuprofen (ADVIL,MOTRIN) 200 MG tablet Take 400 mg by mouth every 6 (six) hours as needed for moderate pain.    Historical Provider, MD  naproxen sodium (ANAPROX) 220 MG tablet Take 440 mg by mouth daily as needed (pain).    Historical Provider, MD  traMADol (ULTRAM) 50 MG tablet Take 1 tablet (50 mg total) by mouth every 6 (six) hours as needed. 12/26/14   Bethann Berkshire, MD   BP 129/79 mmHg  Pulse 83  Resp 20  Ht 5\' 10"  (1.778 m)  Wt 190 lb (86.183 kg)  BMI 27.26 kg/m2  SpO2 95% Physical Exam  Constitutional: He is oriented to person, place, and time. He appears well-nourished.  HENT:  Head: Normocephalic.  Mouth/Throat: Oropharynx is clear and moist.  Eyes: Conjunctivae are normal.  Neck: No tracheal deviation present.  Cardiovascular: Normal rate.   Pulmonary/Chest: Effort normal. No stridor. No respiratory distress.  Patient's chest has mass on the left lower ribs. Overlying it appears to have a peau d'orange. It is mildly indurated with stippling. No drainable fluid collection.  Abdominal: Soft. There is no tenderness. There is no guarding.  Musculoskeletal: Normal range of motion. He exhibits no edema.  Neurological: He is oriented to person, place, and time. No cranial nerve deficit.  Skin: Skin is warm and dry. No rash noted. He is not diaphoretic.  Psychiatric: He has a normal mood and affect. His  behavior is normal.  Nursing note and vitals reviewed.   ED Course  Procedures (including critical care time) Labs Review Labs Reviewed - No data to display  Imaging Review No results found. I have personally reviewed and evaluated these images and lab results as part of my medical decision-making.   EKG Interpretation None      MDM   Final diagnoses:  None   patient is a 49 year old male presenting  today with chest mass wall pain. Patient had a mass diagnosed 2 weeks ago and has a follow-up biopsy scheduled for Monday. He ran out of Ultram and is here to get more pain control. Chest wall mass changed little bit in character in that it is become more indurated and erythematous. It appears a peau d'orange. Does not appear infectious, no erysipelas I  doubt cellulitis given that it is related to this mass. We will give Ultram to make patient comfortable until Monday.  Margins of erythema drawn.   Courteney Randall An, MD 01/05/15 2324

## 2015-01-08 ENCOUNTER — Encounter (HOSPITAL_COMMUNITY): Payer: Self-pay

## 2015-01-08 ENCOUNTER — Ambulatory Visit (HOSPITAL_COMMUNITY)
Admission: RE | Admit: 2015-01-08 | Discharge: 2015-01-08 | Disposition: A | Discharge status: Home / Self Care | Payer: Self-pay | Source: Ambulatory Visit | Attending: Emergency Medicine | Admitting: Emergency Medicine

## 2015-01-08 DIAGNOSIS — F17210 Nicotine dependence, cigarettes, uncomplicated: Secondary | ICD-10-CM | POA: Insufficient documentation

## 2015-01-08 DIAGNOSIS — R222 Localized swelling, mass and lump, trunk: Secondary | ICD-10-CM | POA: Insufficient documentation

## 2015-01-08 DIAGNOSIS — D649 Anemia, unspecified: Secondary | ICD-10-CM | POA: Insufficient documentation

## 2015-01-08 LAB — CBC
HCT: 30.6 % — ABNORMAL LOW (ref 39.0–52.0)
Hemoglobin: 10.2 g/dL — ABNORMAL LOW (ref 13.0–17.0)
MCH: 27.6 pg (ref 26.0–34.0)
MCHC: 33.3 g/dL (ref 30.0–36.0)
MCV: 82.9 fL (ref 78.0–100.0)
PLATELETS: 398 10*3/uL (ref 150–400)
RBC: 3.69 MIL/uL — AB (ref 4.22–5.81)
RDW: 15.1 % (ref 11.5–15.5)
WBC: 7.6 10*3/uL (ref 4.0–10.5)

## 2015-01-08 LAB — PROTIME-INR
INR: 1.19 (ref 0.00–1.49)
PROTHROMBIN TIME: 15.2 s (ref 11.6–15.2)

## 2015-01-08 LAB — APTT: aPTT: 35 seconds (ref 24–37)

## 2015-01-08 MED ORDER — FENTANYL CITRATE (PF) 100 MCG/2ML IJ SOLN
INTRAMUSCULAR | Status: AC | PRN
  Administered 2015-01-08: 50 ug via INTRAVENOUS

## 2015-01-08 MED ORDER — MIDAZOLAM HCL 2 MG/2ML IJ SOLN
INTRAMUSCULAR | Status: AC | PRN
  Administered 2015-01-08: 1 mg via INTRAVENOUS

## 2015-01-08 MED ORDER — SODIUM CHLORIDE 0.9 % IV SOLN
INTRAVENOUS | Status: AC | PRN
  Administered 2015-01-08: 10 mL/h via INTRAVENOUS

## 2015-01-08 MED ORDER — SODIUM CHLORIDE 0.9 % IV SOLN: Freq: Once | INTRAVENOUS | Status: DC

## 2015-01-08 MED ORDER — MIDAZOLAM HCL 2 MG/2ML IJ SOLN
INTRAMUSCULAR | Status: AC
  Filled 2015-01-08: qty 4

## 2015-01-08 MED ORDER — LIDOCAINE HCL 1 % IJ SOLN
INTRAMUSCULAR | Status: AC
  Filled 2015-01-08: qty 20

## 2015-01-08 MED ORDER — FENTANYL CITRATE (PF) 100 MCG/2ML IJ SOLN
INTRAMUSCULAR | Status: AC
  Filled 2015-01-08: qty 4

## 2015-01-08 NOTE — Procedures (Signed)
Interventional Radiology Procedure Note  Procedure: CT guided left chest wall mass. 3 x 18G bx,  2 x 18G culture.  Findings:  Thickening of the musculature involving the lower left chest wall.  This appears increased slightly from the comparison CT.  On physical exam, the patient has erythema of the skin overlying the mass, region is approximately 10cm x 10cm.   Complications: None Recommendations:  - Ok to shower tomorrow - Do not submerge for 7 days - Routine care   Signed,  Yvone Neu. Loreta Ave, DO

## 2015-01-08 NOTE — Discharge Instructions (Signed)
Needle Biopsy °Care After °These instructions give you information on caring for yourself after your procedure. Your doctor may also give you more specific instructions. Call your doctor if you have any problems or questions after your procedure. °HOME CARE °· Rest for 4 hours after your biopsy, except for getting up to go to the bathroom or as told. °· Keep the places where the needles were put in clean and dry. °¨ Do not put powder or lotion on the sites. °¨ Do not shower until 24 hours after the test. Remove all bandages (dressings) before showering. °¨ Remove all bandages at least once every day. Gently clean the sites with soap and water. Keep putting a new bandage on until the skin is closed. °Finding out the results of your test °Ask your doctor when your test results will be ready. Make sure you follow up and get the test results. °GET HELP RIGHT AWAY IF:  °· You have shortness of breath or trouble breathing. °· You have pain or cramping in your belly (abdomen). °· You feel sick to your stomach (nauseous) or throw up (vomit). °· Any of the places where the needles were put in: °¨ Are puffy (swollen) or red. °¨ Are sore or hot to the touch. °¨ Are draining yellowish-white fluid (pus). °¨ Are bleeding after 10 minutes of pressing down on the site. Have someone keep pressing on any place that is bleeding until you see a doctor. °· You have any unusual pain that will not stop. °· You have a fever. °If you go to the emergency room, tell the nurse that you had a biopsy. Take this paper with you to show the nurse. °MAKE SURE YOU:  °· Understand these instructions. °· Will watch your condition. °· Will get help right away if you are not doing well or get worse. °Document Released: 03/20/2008 Document Revised: 06/30/2011 Document Reviewed: 03/20/2008 °[employer]® Patient Information ©2015 [employer]. This information is not intended to replace advice given to you by your health care provider. Make sure you discuss  any questions you have with your health care provider. ° °

## 2015-01-08 NOTE — Sedation Documentation (Signed)
Patient is resting comfortably. 

## 2015-01-08 NOTE — H&P (Signed)
Chief Complaint: Patient was seen in consultation today for left chest wall mass at the request of Zammit,Joseph  Referring Physician(s): Zammit,Joseph  History of Present Illness: Gene Smith is a 49 y.o. male who presented to ED 12/26/14 with complaints of left chest wall mass associated with pain x 6-8 months with intermittent flare ups in swelling. He states this past week he has noticed increased redness at site. He denies any PMHx, denies any weight loss or appetite change. He states he doesn't currently have a PCP, but is working to get into [facility]. He was seen in ED with imaging and setup for outpatient biopsy. He denies any chest pain, shortness of breath or palpitations. He denies any active signs of bleeding or excessive bruising. He denies any recent fever or chills. The patient denies any history of sleep apnea or chronic oxygen use. He denies any known complications to sedation, but has never had sedation previously.    Past Medical History  Diagnosis Date  . Back abrasion   . Arthritis   . Mass     History reviewed. No pertinent past surgical history.  Allergies: Review of patient's allergies indicates no known allergies.  Medications: Prior to Admission medications   Medication Sig Start Date End Date Taking? Authorizing Provider  acetaminophen (TYLENOL) 500 MG tablet Take 1,000 mg by mouth every 6 (six) hours as needed for moderate pain.   Yes Historical Provider, MD  naproxen sodium (ANAPROX) 220 MG tablet Take 440 mg by mouth daily as needed (pain).   Yes Historical Provider, MD  traMADol (ULTRAM) 50 MG tablet Take 1 tablet (50 mg total) by mouth every 6 (six) hours as needed. 12/26/14  Yes Bethann Berkshire, MD  ibuprofen (ADVIL,MOTRIN) 200 MG tablet Take 400 mg by mouth every 6 (six) hours as needed for moderate pain.    Historical Provider, MD  traMADol (ULTRAM) 50 MG tablet Take 1 tablet (50 mg total) by mouth every 6 (six) hours as needed. 01/05/15    Courteney Lyn Mackuen, MD     History reviewed. No pertinent family history.  Social History   Social History  . Marital Status: Single    Spouse Name: N/A  . Number of Children: N/A  . Years of Education: N/A   Social History Main Topics  . Smoking status: Current Every Day Smoker -- 1.00 packs/day  . Smokeless tobacco: None  . Alcohol Use: No  . Drug Use: No  . Sexual Activity: Not Asked   Other Topics Concern  . None   Social History Narrative    Review of Systems: A 12 point ROS discussed and pertinent positives are indicated in the HPI above.  All other systems are negative.  Review of Systems  Vital Signs: BP 111/67 mmHg  Pulse 68  Temp(Src) 98.4 F (36.9 C) (Oral)  Resp 18  Ht  (1.778 m)  Wt 190 lb (86.183 kg)  BMI 27.26 kg/m2  SpO2 97%  Physical Exam  Constitutional: He is oriented to person, place, and time. No distress.  HENT:  Head: Normocephalic and atraumatic.  Cardiovascular: Normal rate and regular rhythm.  Exam reveals no gallop and no friction rub.   No murmur heard. Pulmonary/Chest: Effort normal.  Rhonchi RUL, CTA all other areas  Abdominal: Soft. Bowel sounds are normal. He exhibits no distension. There is no tenderness.  Neurological: He is alert and oriented to person, place, and time.  Skin: He is not diaphoretic. There is erythema.  Mallampati Score:  MD Evaluation Airway: WNL Heart: WNL Abdomen: WNL Chest/ Lungs: WNL ASA  Classification: 2 Mallampati/Airway Score: Two  Imaging: Dg Chest 2 View  01/05/2015   CLINICAL DATA:  Chest wall mass noticed 1 month ago. Chest pain and shortness of breath, worsening today. Area marked with BB.  EXAM: CHEST  2 VIEW  COMPARISON:  CT chest 12/26/2014  FINDINGS: Heart size is normal. Mediastinal shadows are normal. The lungs are clear. No pleural fluid. Left chest wall swelling shown at CT is not visible by radiography. No underlying rib abnormality seen.  IMPRESSION: Normal chest  radiography. The left chest wall abnormality previously demonstrated by CT is not visible at plain radiography.   Electronically Signed   By: Paulina Fusi M.D.   On: 01/05/2015 17:50   Dg Ribs Unilateral W/chest Left  12/26/2014   CLINICAL DATA:  Left lower rib pain for 6 months with worsening last 2 weeks  EXAM: LEFT RIBS AND CHEST - 3+ VIEW  COMPARISON:  None.  FINDINGS: Three views left ribs submitted. No rib fracture is identified. No destructive bony lesion. There is no pneumothorax.  IMPRESSION: Negative.   Electronically Signed   By: Natasha Mead M.D.   On: 12/26/2014 08:18   Ct Chest W Contrast  12/26/2014   CLINICAL DATA:  Swelling about the anterior aspect of left ribs for 6 months which has worsened over the past 2 weeks. No known injury. Initial encounter.  EXAM: CT CHEST WITH CONTRAST  TECHNIQUE: Multidetector CT imaging of the chest was performed during intravenous contrast administration.  CONTRAST:  75 mL OMNIPAQUE IOHEXOL 300 MG/ML  SOLN  COMPARISON:  Plain films of the chest and left ribs this same day.  FINDINGS: There is a soft tissue attenuation lesion in the anterior aspect of the left lower chest in the region of concern centered about the anterior arc of the left eighth rib which measures approximately 5.0 cm transverse by 8.6 cm oblique AP by 10.0 cm craniocaudal. The lesion appears to arise from intercostal musculature and has a component which extends deep to the diaphragm into the abdomen. No bony destructive change is seen. There is no associated soft tissue calcification or fluid collection. There is subcutaneous edema a along the lower left chest and upper left flank.  Calcific coronary atherosclerosis is identified heart size is normal. No pleural or pericardial effusion. No axillary, hilar or mediastinal lymphadenopathy. The lungs are clear.  Imaged intra-abdominal visceral is unremarkable. No focal bony abnormality is identified.  IMPRESSION: Nonspecific soft tissue lesion in the  lower left chest wall in the region of concern could be secondary to a benign process such as fibromatosis but malignant lesions including undifferentiated sarcoma are also within the differential. Biopsy is recommended for further evaluation.   Electronically Signed   By: Drusilla Kanner M.D.   On: 12/26/2014 12:37    Labs:  CBC:  Recent Labs  12/26/14 0740 01/08/15 0930  WBC 11.5* 7.6  HGB 11.7* 10.2*  HCT 33.9* 30.6*  PLT 437* 398    COAGS:  Recent Labs  01/08/15 0930  INR 1.19  APTT 35    BMP:  Recent Labs  12/26/14 0740  NA 130*  K 3.7  CL 94*  CO2 26  GLUCOSE 112*  BUN 14  CALCIUM 8.5*  CREATININE 0.59*  GFRNONAA >60  GFRAA >60    LIVER FUNCTION TESTS:  Recent Labs  12/26/14 0740  BILITOT 0.3  AST 17  ALT 11*  ALKPHOS 74  PROT 8.6*  ALBUMIN 3.6    Assessment and Plan: Left chest wall mass x 6-8 months ED imaging CT Chest reviewed, ED physician ordered biopsy  Scheduled for outpatient image guided left anterior chest wall mass biopsy with sedation The patient has been NPO, no blood thinners taken, labs and vitals have been reviewed. Risks and Benefits discussed with the patient including, but not limited to bleeding, infection, damage to adjacent structures or low yield requiring additional tests. All of the patient's questions were answered, patient is agreeable to proceed. Consent signed and in chart. Anemia, denies any active bleeding   Thank you for this interesting consult.  I greatly enjoyed meeting ISADOR CASTILLE and look forward to participating in their care.  A copy of this report was sent to the requesting provider on this date.  SignedBerneta Levins 01/08/2015, 10:41 AM   I spent a total of  30 Minutes in face to face in clinical consultation, greater than 50% of which was counseling/coordinating care for left chest wall mass.

## 2015-01-11 LAB — TISSUE CULTURE
Culture: NO GROWTH
Gram Stain: NONE SEEN

## 2015-01-23 ENCOUNTER — Ambulatory Visit: Payer: Self-pay | Admitting: Physician Assistant

## 2015-01-23 ENCOUNTER — Encounter: Payer: Self-pay | Admitting: Physician Assistant

## 2015-01-23 VITALS — BP 104/62 | HR 77 | Temp 97.5°F | Ht 73.0 in | Wt 183.5 lb

## 2015-01-23 DIAGNOSIS — R222 Localized swelling, mass and lump, trunk: Secondary | ICD-10-CM

## 2015-01-23 DIAGNOSIS — F17218 Nicotine dependence, cigarettes, with other nicotine-induced disorders: Secondary | ICD-10-CM | POA: Insufficient documentation

## 2015-01-23 MED ORDER — CEPHALEXIN 500 MG PO CAPS: 500.0000 mg | ORAL_CAPSULE | Freq: Four times a day (QID) | ORAL | Status: DC

## 2015-01-23 NOTE — Progress Notes (Signed)
BP 104/62 mmHg  Pulse 77  Temp(Src) 97.5 F (36.4 C)  Ht  (1.854 m)  Wt 183 lb 8 oz (83.235 kg)  BMI 24.22 kg/m2  SpO2 96%   Subjective:    Patient ID: Gene Smith, male    DOB: [birth year], 49 y.o.   MRN: [id number]  HPI: [name] is a 49 y.o. male presenting on 01/23/2015 for new pt appointment  HPI  HPI    Mass   Additional comments: pt states he has a red itchy rash on his RUQ. pt states it is very painful and makes it hard to breathe. pt states it feels like "someone kicked you in the ribs" pt states he has had it for 6-22months. pt states it gets swollen. pt states he went to [facility] and got xrays, cat scans, and a biopsy done. pt states he believes it may be cancer. pt takes IBU or tylenol to help with pain.     Last edited by Jacquelin Hawking, PA-C on 01/23/2015 10:23 PM. (History)     Reviewed CT chest and biopsy CT. Report for Pathology not in chart. Gene Smith called about it and put in ticket. Will try to locate this information.  Relevant past medical, surgical, family and social history reviewed and updated as indicated. Interim medical history since our last visit reviewed. Allergies and medications reviewed and updated.  Review of Systems  Constitutional: Negative for fever, appetite change, fatigue and unexpected weight change.  HENT: Negative for trouble swallowing.   Respiratory: Positive for cough, chest tightness and shortness of breath.   Cardiovascular: Positive for chest pain.  Gastrointestinal: Positive for abdominal pain. Negative for vomiting.  Endocrine: Negative for cold intolerance and heat intolerance.  Genitourinary: Negative for difficulty urinating.  Skin: Positive for color change and rash.  Neurological: Negative for syncope and headaches.  Hematological: Negative for adenopathy.    Per HPI unless specifically indicated above     Objective:    BP 104/62 mmHg  Pulse 77  Temp(Src) 97.5 F (36.4 C)  Ht  (1.854 m)  Wt 183 lb  8 oz (83.235 kg)  BMI 24.22 kg/m2  SpO2 96%  Wt Readings from Last 3 Encounters:  01/23/15 183 lb 8 oz (83.235 kg)  01/08/15 190 lb (86.183 kg)  01/05/15 190 lb (86.183 kg)    Physical Exam  Constitutional: He is oriented to person, place, and time. He appears well-developed and well-nourished. No distress.  HENT:  Head: Normocephalic and atraumatic.  Eyes: Conjunctivae and EOM are normal. Pupils are equal, round, and reactive to light.  Neck: Normal range of motion. Neck supple. No thyromegaly present.  Cardiovascular: Normal rate and regular rhythm.   Pulmonary/Chest: Effort normal and breath sounds normal. No respiratory distress. He has no wheezes. He has no rales. He exhibits tenderness.  Abdominal: Soft. Bowel sounds are normal. He exhibits mass. He exhibits no distension. There is tenderness. There is rebound. There is no guarding.  Musculoskeletal: Normal range of motion. He exhibits no edema.  Lymphadenopathy:    He has no cervical adenopathy.  Neurological: He is alert and oriented to person, place, and time.  Skin: Skin is warm and dry. Rash noted. There is erythema.     Large 11 x 5 inch red indurated area L lower chest wall/LUQ. Area is mildly tender  Psychiatric: He has a normal mood and affect. His behavior is normal. Judgment and thought content normal.     11 x 4 in  redness  Results for orders placed or performed during the hospital encounter of 01/08/15  Tissue culture  Result Value Ref Range   Specimen Description TISSUE CHEST    Special Requests CHEST WALL MASS LEFT SOFT TISSUE MASS    Gram Stain      NO WBC SEEN NO ORGANISMS SEEN Performed at [facility]    Culture      NO GROWTH 3 DAYS Performed at [facility]    Report Status 01/11/2015 FINAL   APTT upon arrival  Result Value Ref Range   aPTT 35 24 - 37 seconds  CBC upon arrival  Result Value Ref Range   WBC 7.6 4.0 - 10.5 K/uL   RBC 3.69 (L) 4.22 - 5.81 MIL/uL   Hemoglobin  10.2 (L) 13.0 - 17.0 g/dL   HCT 16.1 (L) 09.6 - 04.5 %   MCV 82.9 78.0 - 100.0 fL   MCH 27.6 26.0 - 34.0 pg   MCHC 33.3 30.0 - 36.0 g/dL   RDW 40.9 81.1 - 91.4 %   Platelets 398 150 - 400 K/uL  Protime-INR upon arrival  Result Value Ref Range   Prothrombin Time 15.2 11.6 - 15.2 seconds   INR 1.19 0.00 - 1.49      Assessment & Plan:     Mass- refer pt to surgeon. In light of pt's lack of insurance, will refer to [facility]. Will place pt on keflex in light of clinical appearance.  Will investigate pathology report and attempt to locate it. Told pt that we will call him when we have information on this since he is so anxious to hear whether he has cancer. Reassured pt that we will not discuss this with his mother  Smoker  - will f/u OV 2 wk

## 2015-02-06 ENCOUNTER — Ambulatory Visit: Payer: Self-pay | Admitting: Physician Assistant

## 2015-02-06 ENCOUNTER — Encounter: Payer: Self-pay | Admitting: Physician Assistant

## 2015-02-06 VITALS — BP 100/68 | HR 73 | Temp 97.7°F | Ht 69.5 in | Wt 182.0 lb

## 2015-02-06 DIAGNOSIS — R222 Localized swelling, mass and lump, trunk: Secondary | ICD-10-CM

## 2015-02-06 DIAGNOSIS — Z13220 Encounter for screening for lipoid disorders: Secondary | ICD-10-CM

## 2015-02-06 NOTE — Progress Notes (Signed)
BP 100/68 mmHg  Pulse 73  Temp(Src) 97.7 F (36.5 C)  Ht 5' 9.5" (1.765 m)  Wt 182 lb (82.555 kg)  BMI 26.50 kg/m2  SpO2 97%   Subjective:    Patient ID: Gene Smith, male    DOB: 04/04/1966, 49 y.o.   MRN: [id number]  HPI: Gene Smith is a 49 y.o. male presenting on 02/06/2015 for Mass   HPI  Chief Complaint  Patient presents with  . Mass     L rib area[facility] and is here to get results of bx     Pt finished keflex. Says redness and swelling dreased. It still hurts  [facility] Referral made at previous OV.   Relevant past medical, surgical, family and social history reviewed and updated as indicated. Interim medical history since our last visit reviewed. Allergies and medications reviewed and updated.   Current outpatient prescriptions:  .  acetaminophen (TYLENOL) 500 MG tablet, Take 1,000 mg by mouth every 6 (six) hours as needed for moderate pain., Disp: , Rfl:  .  ibuprofen (ADVIL,MOTRIN) 200 MG tablet, Take 400 mg by mouth every 6 (six) hours as needed for moderate pain., Disp: , Rfl:    Review of Systems  Constitutional: Negative for fever, chills, diaphoresis, appetite change, fatigue and unexpected weight change.  HENT: Positive for dental problem and hearing loss. Negative for congestion, drooling, ear pain, facial swelling, mouth sores, sneezing, sore throat, trouble swallowing and voice change.   Eyes: Negative for pain, discharge, redness, itching and visual disturbance.  Respiratory: Positive for cough, choking, shortness of breath and wheezing.   Cardiovascular: Positive for chest pain. Negative for palpitations and leg swelling.  Gastrointestinal: Positive for abdominal pain and constipation. Negative for vomiting, diarrhea and blood in stool.  Endocrine: Positive for cold intolerance, heat intolerance and polydipsia.  Genitourinary: Positive for dysuria and decreased urine volume. Negative for  hematuria.  Musculoskeletal: Positive for gait problem. Negative for back pain and arthralgias.  Skin: Negative for rash.  Allergic/Immunologic: Positive for environmental allergies.  Neurological: Negative for seizures, syncope, light-headedness and headaches.  Hematological: Negative for adenopathy.  Psychiatric/Behavioral: Positive for dysphoric mood and agitation. Negative for suicidal ideas. The patient is nervous/anxious.     Per HPI unless specifically indicated above     Objective:    BP 100/68 mmHg  Pulse 73  Temp(Src) 97.7 F (36.5 C)  Ht 5' 9.5" (1.765 m)  Wt 182 lb (82.555 kg)  BMI 26.50 kg/m2  SpO2 97%  Wt Readings from Last 3 Encounters:  02/06/15 182 lb (82.555 kg)  01/23/15 183 lb 8 oz (83.235 kg)  01/08/15 190 lb (86.183 kg)    Physical Exam  Constitutional: He is oriented to person, place, and time. He appears well-developed and well-nourished.  HENT:  Head: Normocephalic and atraumatic.  Neck: Neck supple.  Cardiovascular: Normal rate and regular rhythm.   Pulmonary/Chest: Effort normal and breath sounds normal. He has no wheezes.  Redness decreased on chest wall.  Mass still present without notable change in size.  Abdominal: Soft. Bowel sounds are normal. There is no tenderness.  Lymphadenopathy:    He has no cervical adenopathy.  Neurological: He is alert and oriented to person, place, and time.  Skin: Skin is warm and dry.  Psychiatric: He has a normal mood and affect. His behavior is normal.  Vitals reviewed.       Assessment & Plan:  Encounter Diagnoses  Name Primary?  . Mass of chest wall, left Yes  . Screening cholesterol level    -check fasting lipids. Also cbc -discussed with pt that pathology was sent to [facility] for review and results not in yet. Will call pt and have him RTO to discuss when available -plan to f/u in one month

## 2015-02-15 ENCOUNTER — Telehealth: Payer: Self-pay | Admitting: Student

## 2015-02-15 NOTE — Telephone Encounter (Signed)
-----   Message from Baldwin JamaicaBerenice Villegas, LPN sent at [id number]/24/2016  6:36 PM EDT ----- Regarding: Referral Call pt and ask how his appt with [facility] went and if he got an appt scheduled.

## 2015-02-15 NOTE — Telephone Encounter (Signed)
Pt returned my call. Pt spoke to financial counselor and is expecting an application in the mail. Pt is to fill out the application and send it back in order to get approved and schedule appt. I advised pt to make sure to fill it out and return it as soon as he can, and to let me know what happens afterwards. Pt understood and agreed.

## 2015-02-21 ENCOUNTER — Encounter (HOSPITAL_COMMUNITY): Payer: Self-pay

## 2015-03-06 ENCOUNTER — Ambulatory Visit: Payer: Self-pay | Admitting: Physician Assistant

## 2015-03-06 ENCOUNTER — Encounter: Payer: Self-pay | Admitting: Physician Assistant

## 2015-03-06 VITALS — BP 134/78 | HR 85 | Temp 98.1°F | Ht 69.5 in | Wt 183.0 lb

## 2015-03-06 DIAGNOSIS — R222 Localized swelling, mass and lump, trunk: Secondary | ICD-10-CM

## 2015-03-06 DIAGNOSIS — F17218 Nicotine dependence, cigarettes, with other nicotine-induced disorders: Secondary | ICD-10-CM

## 2015-03-06 NOTE — Progress Notes (Signed)
   BP 134/78 mmHg  Pulse 85  Temp(Src) 98.1 F (36.7 C)  Ht 5' 9.5" (1.765 m)  Wt 183 lb (83.008 kg)  BMI 26.65 kg/m2  SpO2 98%   Subjective:    Patient ID: Gene Smith, male    DOB: 11/16/1965, 49 y.o.   MRN: [id number]  HPI: Gene Smith is a 49 y.o. male presenting on 03/06/2015 for Mass   HPI   [facility] He has f/u there in 2 wk.  Pt didn't get his labs drawn.   Relevant past medical, surgical, family and social history reviewed and updated as indicated. Interim medical history since our last visit reviewed. Allergies and medications reviewed and updated.  Review of Systems  Constitutional: Negative for fever, chills, diaphoresis, appetite change, fatigue and unexpected weight change.  HENT: Positive for dental problem. Negative for congestion, drooling, ear pain, facial swelling, hearing loss, mouth sores, sneezing, sore throat, trouble swallowing and voice change.   Eyes: Positive for visual disturbance. Negative for pain, discharge, redness and itching.  Respiratory: Negative for cough, choking, shortness of breath and wheezing.   Cardiovascular: Negative for chest pain, palpitations and leg swelling.  Gastrointestinal: Positive for abdominal pain. Negative for vomiting, diarrhea, constipation and blood in stool.  Endocrine: Positive for heat intolerance. Negative for cold intolerance and polydipsia.  Genitourinary: Negative for dysuria, hematuria and decreased urine volume.  Musculoskeletal: Positive for arthralgias. Negative for back pain and gait problem.  Skin: Negative for rash.  Allergic/Immunologic: Positive for environmental allergies.  Neurological: Negative for seizures, syncope, light-headedness and headaches.  Hematological: Negative for adenopathy.  Psychiatric/Behavioral: Negative for suicidal ideas, dysphoric mood and agitation. The patient is not nervous/anxious.     Per HPI unless specifically indicated above     Objective:     BP 134/78 mmHg  Pulse 85  Temp(Src) 98.1 F (36.7 C)  Ht 5' 9.5" (1.765 m)  Wt 183 lb (83.008 kg)  BMI 26.65 kg/m2  SpO2 98%  Wt Readings from Last 3 Encounters:  03/06/15 183 lb (83.008 kg)  02/06/15 182 lb (82.555 kg)  01/23/15 183 lb 8 oz (83.235 kg)    Physical Exam  Constitutional: He is oriented to person, place, and time. He appears well-developed and well-nourished.  Pulmonary/Chest: Effort normal.  Neurological: He is alert and oriented to person, place, and time.  Skin: Skin is warm and dry.  Still has red area L chest wall.  Psychiatric: He has a normal mood and affect. His behavior is normal.       Assessment & Plan:   Encounter Diagnoses  Name Primary?  . Mass of chest wall, left Yes  . Nicotine dependence, cigarettes, with other nicotine-induced disorders    -pt to get labs drawn- will call with reults.  -counseled on smoking cessation -[facility] -f/u here 2 mo. rto sooner prn

## 2015-03-17 ENCOUNTER — Emergency Department (HOSPITAL_COMMUNITY)
Admission: EM | Admit: 2015-03-17 | Discharge: 2015-03-17 | Disposition: A | Discharge status: Home / Self Care | Payer: Self-pay | Attending: Emergency Medicine | Admitting: Emergency Medicine

## 2015-03-17 ENCOUNTER — Encounter (HOSPITAL_COMMUNITY): Payer: Self-pay

## 2015-03-17 DIAGNOSIS — Z8659 Personal history of other mental and behavioral disorders: Secondary | ICD-10-CM | POA: Insufficient documentation

## 2015-03-17 DIAGNOSIS — R222 Localized swelling, mass and lump, trunk: Secondary | ICD-10-CM | POA: Insufficient documentation

## 2015-03-17 DIAGNOSIS — M1990 Unspecified osteoarthritis, unspecified site: Secondary | ICD-10-CM | POA: Insufficient documentation

## 2015-03-17 DIAGNOSIS — F17200 Nicotine dependence, unspecified, uncomplicated: Secondary | ICD-10-CM | POA: Insufficient documentation

## 2015-03-17 DIAGNOSIS — Z8719 Personal history of other diseases of the digestive system: Secondary | ICD-10-CM | POA: Insufficient documentation

## 2015-03-17 DIAGNOSIS — Z87828 Personal history of other (healed) physical injury and trauma: Secondary | ICD-10-CM | POA: Insufficient documentation

## 2015-03-17 MED ORDER — TRAMADOL HCL 50 MG PO TABS
50.0000 mg | ORAL_TABLET | Freq: Four times a day (QID) | ORAL | Status: DC | PRN
Start: 2015-03-17 — End: 2015-05-08

## 2015-03-17 MED ORDER — TRAMADOL HCL 50 MG PO TABS
50.0000 mg | ORAL_TABLET | Freq: Once | ORAL | Status: AC
  Administered 2015-03-17: 50 mg via ORAL
  Filled 2015-03-17: qty 1

## 2015-03-17 NOTE — ED Notes (Signed)
Pt present w/ raised red mass to the left rib cage. Pt has been seen in the past, X-rays & ct scans done. Pt states MRI scheduled & has had a biopsy done. Pt says pan has gotten worse & OTC meds not helping.

## 2015-03-17 NOTE — ED Provider Notes (Signed)
CSN: [id number]     Arrival date & time 03/17/15  0024 History   First MD Initiated Contact with Patient 03/17/15 0205     Chief Complaint  Patient presents with  . Abdominal Pain     (Consider location/radiation/quality/duration/timing/severity/associated sxs/prior Treatment) Patient is a 49 y.o. male presenting with abdominal pain. The history is provided by the patient.  Abdominal Pain He has a tumor of the left chest wall and is scheduled to see his surgeon in 2 days and have an MRI scan at the same time. He does not have any medication for pain at home and has been taking acetaminophen and ibuprofen. This has not been giving him adequate pain relief. He rates pain at 8/10. He states that the chest wall mass has not been enlarging. He denies fever, night sweats, weight loss.  Past Medical History  Diagnosis Date  . Back abrasion   . Arthritis   . Mass   . Depression   . Anxiety   . GERD (gastroesophageal reflux disease)    History reviewed. No pertinent past surgical history. Family History  Problem Relation Age of Onset  . Heart disease Father   . Mental illness Father   . Mental illness Sister   . Cancer Maternal Uncle   . Diabetes Maternal Grandmother   . Diabetes Maternal Grandfather   . Diabetes Paternal Grandmother   . Diabetes Paternal Grandfather    Social History  Substance Use Topics  . Smoking status: Current Every Day Smoker -- 1.00 packs/day for 38 years  . Smokeless tobacco: None  . Alcohol Use: No    Review of Systems  Gastrointestinal: Positive for abdominal pain.  All other systems reviewed and are negative.     Allergies  Pollen extract  Home Medications   Prior to Admission medications   Medication Sig Start Date End Date Taking? Authorizing Provider  acetaminophen (TYLENOL) 500 MG tablet Take 1,000 mg by mouth every 6 (six) hours as needed for moderate pain.   Yes Historical Provider, MD  ibuprofen (ADVIL,MOTRIN) 200 MG tablet Take 400  mg by mouth every 6 (six) hours as needed for moderate pain.   Yes Historical Provider, MD   BP 108/69 mmHg  Pulse 86  Temp(Src) 97.6 F (36.4 C) (Oral)  Resp 16  Ht 5\' 10"  (1.778 m)  Wt 180 lb (81.647 kg)  BMI 25.83 kg/m2  SpO2 100% Physical Exam  Nursing note and vitals reviewed.  49 year old male, resting comfortably and in no acute distress. Vital signs are normal. Oxygen saturation is 100%, which is normal. Head is normocephalic and atraumatic. PERRLA, EOMI. Oropharynx is clear. Neck is nontender and supple without adenopathy or JVD. Back is nontender and there is no CVA tenderness. Lungs are clear without rales, wheezes, or rhonchi. Chest: Masses present in the left lower chest wall and appears to be attached to the underlying ribs. There is some overlying erythema. There is no fluctuance and no warmth and no drainage. Heart has regular rate and rhythm without murmur. Abdomen is soft, flat, nontender without masses or hepatosplenomegaly and peristalsis is normoactive. Extremities have no cyanosis or edema, full range of motion is present. Skin is warm and dry without rash. Neurologic: Mental status is normal, cranial nerves are intact, there are no motor or sensory deficits.  ED Course  Procedures (including critical care time)   MDM   Final diagnoses:  Mass of chest wall, left    Chest wall mass. Old records are  reviewed and records from care everywhere have been reviewed. This mass has been biopsied and biopsy was nondiagnostic. He is scheduled to see his surgeon at [facility] on November 28 and is scheduled for an MRI scan at the same visit. He is presenting only to get medication for pain relief. He has had good relief with tramadol in the past and is given a prescription for same.    Dione Booze, MD 03/17/15 [phone]

## 2015-03-17 NOTE — ED Notes (Signed)
Pt has a large mass on his left anterior ribcage exteriorly, that he has been evaluated for possible surgery to remove.  Pt reports he is taking otc meds without relief and feels he needs something stronger

## 2015-03-17 NOTE — ED Notes (Signed)
Pt alert & oriented x4, stable gait. Patient given discharge instructions, paperwork & prescription(s). Patient  instructed to stop at the registration desk to finish any additional paperwork. Patient verbalized understanding. Pt left department w/ no further questions. 

## 2015-03-17 NOTE — Discharge Instructions (Signed)
Go for [facility] appointment November 28 at [facility] as scheduled.  Tramadol tablets What is this medicine? TRAMADOL (TRA ma dole) is a pain reliever. It is used to treat moderate to severe pain in adults. This medicine may be used for other purposes; ask your health care provider or pharmacist if you have questions. What should I tell my health care provider before I take this medicine? They need to know if you have any of these conditions: -brain tumor -depression -drug abuse or addiction -head injury -if you frequently drink alcohol containing drinks -kidney disease or trouble passing urine -liver disease -lung disease, asthma, or breathing problems -seizures or epilepsy -suicidal thoughts, plans, or attempt; a previous suicide attempt by you or a family member -an unusual or allergic reaction to tramadol, codeine, other medicines, foods, dyes, or preservatives -pregnant or trying to get pregnant -breast-feeding How should I use this medicine? Take this medicine by mouth with a full glass of water. Follow the directions on the prescription label. If the medicine upsets your stomach, take it with food or milk. Do not take more medicine than you are told to take. Talk to your pediatrician regarding the use of this medicine in children. Special care may be needed. Overdosage: If you think you have taken too much of this medicine contact a poison control center or emergency room at once. NOTE: This medicine is only for you. Do not share this medicine with others. What if I miss a dose? If you miss a dose, take it as soon as you can. If it is almost time for your next dose, take only that dose. Do not take double or extra doses. What may interact with this medicine? Do not take this medicine with any of the following medications: -MAOIs like Carbex, Eldepryl, Marplan, Nardil, and Parnate This medicine may also interact with the following medications: -alcohol or medicines that  contain alcohol -antihistamines -benzodiazepines -bupropion -carbamazepine or oxcarbazepine -clozapine -cyclobenzaprine -digoxin -furazolidone -linezolid -medicines for depression, anxiety, or psychotic disturbances -medicines for migraine headache like almotriptan, eletriptan, frovatriptan, naratriptan, rizatriptan, sumatriptan, zolmitriptan -medicines for pain like pentazocine, buprenorphine, butorphanol, meperidine, nalbuphine, and propoxyphene -medicines for sleep -muscle relaxants -naltrexone -phenobarbital -phenothiazines like perphenazine, thioridazine, chlorpromazine, mesoridazine, fluphenazine, prochlorperazine, promazine, and trifluoperazine -procarbazine -warfarin This list may not describe all possible interactions. Give your health care provider a list of all the medicines, herbs, non-prescription drugs, or dietary supplements you use. Also tell them if you smoke, drink alcohol, or use illegal drugs. Some items may interact with your medicine. What should I watch for while using this medicine? Tell your doctor or health care professional if your pain does not go away, if it gets worse, or if you have new or a different type of pain. You may develop tolerance to the medicine. Tolerance means that you will need a higher dose of the medicine for pain relief. Tolerance is normal and is expected if you take this medicine for a long time. Do not suddenly stop taking your medicine because you may develop a severe reaction. Your body becomes used to the medicine. This does NOT mean you are addicted. Addiction is a behavior related to getting and using a drug for a non-medical reason. If you have pain, you have a medical reason to take pain medicine. Your doctor will tell you how much medicine to take. If your doctor wants you to stop the medicine, the dose will be slowly lowered over time to avoid any side effects. You may  get drowsy or dizzy. Do not drive, use machinery, or do anything  that needs mental alertness until you know how this medicine affects you. Do not stand or sit up quickly, especially if you are an older patient. This reduces the risk of dizzy or fainting spells. Alcohol can increase or decrease the effects of this medicine. Avoid alcoholic drinks. You may have constipation. Try to have a bowel movement at least every 2 to 3 days. If you do not have a bowel movement for 3 days, call your doctor or health care professional. Your mouth may get dry. Chewing sugarless gum or sucking hard candy, and drinking plenty of water may help. Contact your doctor if the problem does not go away or is severe. What side effects may I notice from receiving this medicine? Side effects that you should report to your doctor or health care professional as soon as possible: -allergic reactions like skin rash, itching or hives, swelling of the face, lips, or tongue -breathing difficulties, wheezing -confusion -itching -light headedness or fainting spells -redness, blistering, peeling or loosening of the skin, including inside the mouth -seizures Side effects that usually do not require medical attention (report to your doctor or health care professional if they continue or are bothersome): -constipation -dizziness -drowsiness -headache -nausea, vomiting This list may not describe all possible side effects. Call your doctor for medical advice about side effects. You may report side effects to FDA at 1-800-FDA-1088. Where should I keep my medicine? Keep out of the reach of children. This medicine may cause accidental overdose and death if it taken by other adults, children, or pets. Mix any unused medicine with a substance like cat litter or coffee grounds. Then throw the medicine away in a sealed container like a sealed bag or a coffee can with a lid. Do not use the medicine after the expiration date. Store at room temperature between 15 and 30 degrees C (59 and 86 degrees F). NOTE:  This sheet is a summary. It may not cover all possible information. If you have questions about this medicine, talk to your doctor, pharmacist, or health care provider.    2016, [employer]vier/[employer] Standard. (2013-06-03 15:42:09)

## 2015-05-08 ENCOUNTER — Ambulatory Visit: Payer: Self-pay | Admitting: Physician Assistant

## 2015-05-08 ENCOUNTER — Encounter: Payer: Self-pay | Admitting: Physician Assistant

## 2015-05-08 VITALS — BP 120/80 | HR 84 | Temp 98.1°F | Ht 69.5 in | Wt 188.0 lb

## 2015-05-08 DIAGNOSIS — R222 Localized swelling, mass and lump, trunk: Secondary | ICD-10-CM

## 2015-05-08 DIAGNOSIS — F17218 Nicotine dependence, cigarettes, with other nicotine-induced disorders: Secondary | ICD-10-CM

## 2015-05-08 NOTE — Progress Notes (Signed)
BP 120/80 mmHg  Pulse 84  Temp(Src) 98.1 F (36.7 C)  Ht 5' 9.5" (1.765 m)  Wt 188 lb (85.276 kg)  BMI 27.37 kg/m2  SpO2 97%   Subjective:    Patient ID: Gene Smith, male    DOB: Aug 17, 1965, 50 y.o.   MRN: [id number]  HPI: Gene Smith is a 50 y.o. male presenting on 05/08/2015 for Mass   HPI Chief Complaint  Patient presents with  . Mass    states mass was found to be r/t infection, not cancer   Pt states he is not on antibiotics at this time but has f/u at [facility] on January 27.  Reviewed Care Everywhere reports of chronic inflammation/osteomyelitis.   Relevant past medical, surgical, family and social history reviewed and updated as indicated. Interim medical history since our last visit reviewed. Allergies and medications reviewed and updated.   Current outpatient prescriptions:  .  acetaminophen (TYLENOL) 500 MG tablet, Take 1,000 mg by mouth every 6 (six) hours as needed for moderate pain., Disp: , Rfl:  .  gabapentin (NEURONTIN) 300 MG capsule, Take 300 mg by mouth 3 (three) times daily., Disp: , Rfl:  .  ibuprofen (ADVIL,MOTRIN) 200 MG tablet, Take 400 mg by mouth every 6 (six) hours as needed for moderate pain., Disp: , Rfl:    Review of Systems  Constitutional: Negative for fever, chills, diaphoresis, appetite change, fatigue and unexpected weight change.  HENT: Positive for dental problem, hearing loss, mouth sores and sneezing. Negative for congestion, drooling, ear pain, facial swelling, sore throat, trouble swallowing and voice change.   Eyes: Positive for visual disturbance. Negative for pain, discharge, redness and itching.  Respiratory: Positive for shortness of breath. Negative for cough, choking and wheezing.   Cardiovascular: Negative for chest pain, palpitations and leg swelling.  Gastrointestinal: Negative for vomiting, abdominal pain, diarrhea, constipation and blood in stool.  Endocrine: Positive for heat intolerance. Negative for cold intolerance  and polydipsia.  Genitourinary: Negative for dysuria, hematuria and decreased urine volume.  Musculoskeletal: Positive for back pain and arthralgias. Negative for gait problem.  Skin: Negative for rash.  Allergic/Immunologic: Positive for environmental allergies.  Neurological: Negative for seizures, syncope, light-headedness and headaches.  Hematological: Negative for adenopathy.  Psychiatric/Behavioral: Negative for suicidal ideas, dysphoric mood and agitation. The patient is not nervous/anxious.     Per HPI unless specifically indicated above     Objective:    BP 120/80 mmHg  Pulse 84  Temp(Src) 98.1 F (36.7 C)  Ht 5' 9.5" (1.765 m)  Wt 188 lb (85.276 kg)  BMI 27.37 kg/m2  SpO2 97%  Wt Readings from Last 3 Encounters:  05/08/15 188 lb (85.276 kg)  03/17/15 180 lb (81.647 kg)  03/06/15 183 lb (83.008 kg)    Physical Exam  Constitutional: He is oriented to person, place, and time. He appears well-developed and well-nourished.  HENT:  Head: Normocephalic and atraumatic.  Neck: Neck supple.  Cardiovascular: Normal rate and regular rhythm.   Pulmonary/Chest: Effort normal and breath sounds normal. He has no wheezes. He exhibits mass.    Mass appears smaller.  Surgical wound open with no drainage or odor  Abdominal: Soft. Bowel sounds are normal. There is no tenderness.  Lymphadenopathy:    He has no cervical adenopathy.  Neurological: He is alert and oriented to person, place, and time.  Skin: Skin is warm and dry.  Psychiatric: He has a normal mood and affect. His behavior is normal.  Vitals reviewed.  Assessment & Plan:   Encounter Diagnoses  Name Primary?  . Mass of chest wall, left Yes  . Nicotine dependence, cigarettes, with other nicotine-induced disorders    -counseled on smoking cessation -pt to f/u at [facility] as scheduled -f/u 3 mo.  RTO sooner prn

## 2015-08-08 ENCOUNTER — Ambulatory Visit: Payer: Self-pay | Admitting: Physician Assistant

## 2015-08-08 ENCOUNTER — Encounter: Payer: Self-pay | Admitting: Physician Assistant

## 2015-08-08 VITALS — BP 116/70 | HR 76 | Temp 97.9°F | Ht 69.5 in | Wt 202.0 lb

## 2015-08-08 DIAGNOSIS — R222 Localized swelling, mass and lump, trunk: Secondary | ICD-10-CM

## 2015-08-08 DIAGNOSIS — Z1211 Encounter for screening for malignant neoplasm of colon: Secondary | ICD-10-CM

## 2015-08-08 DIAGNOSIS — Z125 Encounter for screening for malignant neoplasm of prostate: Secondary | ICD-10-CM

## 2015-08-08 DIAGNOSIS — F17218 Nicotine dependence, cigarettes, with other nicotine-induced disorders: Secondary | ICD-10-CM

## 2015-08-08 NOTE — Progress Notes (Signed)
BP 116/70 mmHg  Pulse 76  Temp(Src) 97.9 F (36.6 C)  Ht 5' 9.5" (1.765 m)  Wt 202 lb (91.627 kg)  BMI 29.41 kg/m2  SpO2 97%   Subjective:    Patient ID: Gene Smith, male    DOB: [birth year], 50 y.o.   MRN: [id number]  HPI: Gene Smith is a 50 y.o. male presenting on 08/08/2015 for Mass   HPI Pt last went to [facility] 3 or 4 wk ago for his chest wall mass.  He is going back again in 3 or 4 wk.  Pt has STILL not gotten labs drawn that were ordered in October.    Pt is still smoking.  Denies other problems besides the chest wall mass.  Relevant past medical, surgical, family and social history reviewed and updated as indicated. Interim medical history since our last visit reviewed. Allergies and medications reviewed and updated.   Current outpatient prescriptions:  .  acetaminophen (TYLENOL) 500 MG tablet, Take 1,000 mg by mouth every 6 (six) hours as needed for moderate pain., Disp: , Rfl:  .  fluconazole (DIFLUCAN) 200 MG tablet, Take 200 mg by mouth 3 (three) times daily., Disp: , Rfl:  .  gabapentin (NEURONTIN) 300 MG capsule, Take 300 mg by mouth 3 (three) times daily., Disp: , Rfl:  .  ibuprofen (ADVIL,MOTRIN) 200 MG tablet, Take 400 mg by mouth every 6 (six) hours as needed for moderate pain., Disp: , Rfl:    Review of Systems  Constitutional: Positive for diaphoresis. Negative for fever, chills, appetite change, fatigue and unexpected weight change.  HENT: Positive for dental problem and hearing loss. Negative for congestion, drooling, ear pain, facial swelling, mouth sores, sneezing, sore throat, trouble swallowing and voice change.   Eyes: Positive for itching. Negative for pain, discharge and redness.  Respiratory: Negative for cough, choking, shortness of breath and wheezing.   Cardiovascular: Negative for chest pain, palpitations and leg swelling.  Gastrointestinal: Positive for abdominal pain (at mass/infection site). Negative for vomiting, diarrhea, constipation  and blood in stool.  Endocrine: Positive for heat intolerance. Negative for cold intolerance and polydipsia.  Genitourinary: Negative for dysuria, hematuria and decreased urine volume.  Musculoskeletal: Positive for arthralgias. Negative for back pain and gait problem.  Skin: Negative for rash.  Allergic/Immunologic: Positive for environmental allergies.  Neurological: Negative for seizures, syncope, light-headedness and headaches.  Hematological: Negative for adenopathy.  Psychiatric/Behavioral: Negative for suicidal ideas, dysphoric mood and agitation. The patient is not nervous/anxious.     Per HPI unless specifically indicated above     Objective:    BP 116/70 mmHg  Pulse 76  Temp(Src) 97.9 F (36.6 C)  Ht 5' 9.5" (1.765 m)  Wt 202 lb (91.627 kg)  BMI 29.41 kg/m2  SpO2 97%  Wt Readings from Last 3 Encounters:  08/08/15 202 lb (91.627 kg)  05/08/15 188 lb (85.276 kg)  03/17/15 180 lb (81.647 kg)    Physical Exam  Constitutional: He is oriented to person, place, and time. He appears well-developed and well-nourished.  HENT:  Head: Normocephalic and atraumatic.  Neck: Neck supple.  Cardiovascular: Normal rate and regular rhythm.   Pulmonary/Chest: Effort normal and breath sounds normal. He has no wheezes. He exhibits mass and tenderness.  Red purulent draining wound L anterior chest wall  Abdominal: Soft. Bowel sounds are normal. There is no hepatosplenomegaly. There is no tenderness.  Musculoskeletal: He exhibits no edema.  Lymphadenopathy:    He has no cervical adenopathy.  Neurological: He  is alert and oriented to person, place, and time.  Skin: Skin is warm and dry.  Psychiatric: He has a normal mood and affect. His behavior is normal.  Vitals reviewed.       Assessment & Plan:   Encounter Diagnoses  Name Primary?  . Mass of chest wall, left Yes  . Nicotine dependence, cigarettes, with other nicotine-induced disorders   . Screening for prostate cancer   .  Special screening for malignant neoplasms, colon     -Get fasting labs drawn (psa ordered today, lipids ordered October but never drawn). Will call results -Gave iFOBT for pt to RTO  -counseled on smoking cessation -f/u 3 months.  RTO sooner prn

## 2015-08-08 NOTE — Patient Instructions (Signed)
Smoking Cessation, Tips for Success If you are ready to quit smoking, congratulations! You have chosen to help yourself be healthier. Cigarettes bring nicotine, tar, carbon monoxide, and other irritants into your body. Your lungs, heart, and blood vessels will be able to work better without these poisons. There are many different ways to quit smoking. Nicotine gum, nicotine patches, a nicotine inhaler, or nicotine nasal spray can help with physical craving. Hypnosis, support groups, and medicines help break the habit of smoking. WHAT THINGS CAN I DO TO MAKE QUITTING EASIER?  Here are some tips to help you quit for good:  Pick a date when you will quit smoking completely. Tell all of your friends and family about your plan to quit on that date.  Do not try to slowly cut down on the number of cigarettes you are smoking. Pick a quit date and quit smoking completely starting on that day.  Throw away all cigarettes.   Clean and remove all ashtrays from your home, work, and car.  On a card, write down your reasons for quitting. Carry the card with you and read it when you get the urge to smoke.  Cleanse your body of nicotine. Drink enough water and fluids to keep your urine clear or pale yellow. Do this after quitting to flush the nicotine from your body.  Learn to predict your moods. Do not let a bad situation be your excuse to have a cigarette. Some situations in your life might tempt you into wanting a cigarette.  Never have "just one" cigarette. It leads to wanting another and another. Remind yourself of your decision to quit.  Change habits associated with smoking. If you smoked while driving or when feeling stressed, try other activities to replace smoking. Stand up when drinking your coffee. Brush your teeth after eating. Sit in a different chair when you read the paper. Avoid alcohol while trying to quit, and try to drink fewer caffeinated beverages. Alcohol and caffeine may urge you to  smoke.  Avoid foods and drinks that can trigger a desire to smoke, such as sugary or spicy foods and alcohol.  Ask people who smoke not to smoke around you.  Have something planned to do right after eating or having a cup of coffee. For example, plan to take a walk or exercise.  Try a relaxation exercise to calm you down and decrease your stress. Remember, you may be tense and nervous for the first 2 weeks after you quit, but this will pass.  Find new activities to keep your hands busy. Play with a pen, coin, or rubber band. Doodle or draw things on paper.  Brush your teeth right after eating. This will help cut down on the craving for the taste of tobacco after meals. You can also try mouthwash.   Use oral substitutes in place of cigarettes. Try using lemon drops, carrots, cinnamon sticks, or chewing gum. Keep them handy so they are available when you have the urge to smoke.  When you have the urge to smoke, try deep breathing.  Designate your home as a nonsmoking area.  If you are a heavy smoker, ask your health care provider about a prescription for nicotine chewing gum. It can ease your withdrawal from nicotine.  Reward yourself. Set aside the cigarette money you save and buy yourself something nice.  Look for support from others. Join a support group or smoking cessation program. Ask someone at home or at work to help you with your plan   to quit smoking.  Always ask yourself, "Do I need this cigarette or is this just a reflex?" Tell yourself, "Today, I choose not to smoke," or "I do not want to smoke." You are reminding yourself of your decision to quit.  Do not replace cigarette smoking with electronic cigarettes (commonly called e-cigarettes). The safety of e-cigarettes is unknown, and some may contain harmful chemicals.  If you relapse, do not give up! Plan ahead and think about what you will do the next time you get the urge to smoke. HOW WILL I FEEL WHEN I QUIT SMOKING? You  may have symptoms of withdrawal because your body is used to nicotine (the addictive substance in cigarettes). You may crave cigarettes, be irritable, feel very hungry, cough often, get headaches, or have difficulty concentrating. The withdrawal symptoms are only temporary. They are strongest when you first quit but will go away within 10-14 days. When withdrawal symptoms occur, stay in control. Think about your reasons for quitting. Remind yourself that these are signs that your body is healing and getting used to being without cigarettes. Remember that withdrawal symptoms are easier to treat than the major diseases that smoking can cause.  Even after the withdrawal is over, expect periodic urges to smoke. However, these cravings are generally short lived and will go away whether you smoke or not. Do not smoke! WHAT RESOURCES ARE AVAILABLE TO HELP ME QUIT SMOKING? Your health care provider can direct you to community resources or hospitals for support, which may include:  Group support.  Education.  Hypnosis.  Therapy.   This information is not intended to replace advice given to you by your health care provider. Make sure you discuss any questions you have with your health care provider.   Document Released: 01/04/2004 Document Revised: 04/28/2014 Document Reviewed: 09/23/2012 Elsevier Interactive Patient Education 2016 [employer] Inc[employer]  

## 2015-08-10 ENCOUNTER — Other Ambulatory Visit: Payer: Self-pay | Admitting: Physician Assistant

## 2015-08-10 LAB — CBC WITH DIFFERENTIAL/PLATELET
BASOS ABS: 59 {cells}/uL (ref 0–200)
Basophils Relative: 1 %
EOS ABS: 295 {cells}/uL (ref 15–500)
Eosinophils Relative: 5 %
HCT: 38.7 % (ref 38.5–50.0)
HEMOGLOBIN: 13 g/dL — AB (ref 13.2–17.1)
LYMPHS ABS: 2360 {cells}/uL (ref 850–3900)
Lymphocytes Relative: 40 %
MCH: 30.1 pg (ref 27.0–33.0)
MCHC: 33.6 g/dL (ref 32.0–36.0)
MCV: 89.6 fL (ref 80.0–100.0)
MONOS PCT: 9 %
MPV: 9.9 fL (ref 7.5–12.5)
Monocytes Absolute: 531 cells/uL (ref 200–950)
NEUTROS ABS: 2655 {cells}/uL (ref 1500–7800)
Neutrophils Relative %: 45 %
Platelets: 301 10*3/uL (ref 140–400)
RBC: 4.32 MIL/uL (ref 4.20–5.80)
RDW: 13.9 % (ref 11.0–15.0)
WBC: 5.9 10*3/uL (ref 3.8–10.8)

## 2015-08-10 LAB — LIPID PANEL
CHOL/HDL RATIO: 5.3 ratio — AB (ref <=5.0)
Cholesterol: 222 mg/dL — ABNORMAL HIGH (ref 125–200)
HDL: 42 mg/dL (ref >=40)
LDL CALC: 136 mg/dL — AB (ref <130)
Triglycerides: 222 mg/dL — ABNORMAL HIGH (ref <150)
VLDL: 44 mg/dL — AB (ref <30)

## 2015-08-11 LAB — PSA: PSA: 0.08 ng/mL (ref <=4.00)

## 2015-08-13 LAB — IFOBT (OCCULT BLOOD): IFOBT: NEGATIVE

## 2015-11-07 ENCOUNTER — Ambulatory Visit: Payer: Self-pay | Admitting: Physician Assistant

## 2015-11-07 ENCOUNTER — Encounter: Payer: Self-pay | Admitting: Physician Assistant

## 2015-11-07 VITALS — BP 130/82 | HR 70 | Temp 97.7°F | Ht 69.5 in | Wt 202.2 lb

## 2015-11-07 DIAGNOSIS — R222 Localized swelling, mass and lump, trunk: Secondary | ICD-10-CM

## 2015-11-07 DIAGNOSIS — E785 Hyperlipidemia, unspecified: Secondary | ICD-10-CM | POA: Insufficient documentation

## 2015-11-07 DIAGNOSIS — F17218 Nicotine dependence, cigarettes, with other nicotine-induced disorders: Secondary | ICD-10-CM

## 2015-11-07 NOTE — Progress Notes (Signed)
BP 130/82 mmHg  Pulse 70  Temp(Src) 97.7 F (36.5 C)  Ht 5' 9.5" (1.765 m)  Wt 202 lb 3.2 oz (91.717 kg)  BMI 29.44 kg/m2  SpO2 96%   Subjective:    Patient ID: Gene Smith, male    DOB: 1965-07-16, 50 y.o.   MRN: [id number]  HPI: Gene Smith is a 50 y.o. male presenting on 11/07/2015 for Mass   HPI   Pt is still going to [facility] for his chest mass- he had appointment there yesterday.  He says that he is scheduled for surgery in August.  He says they are going in to "clean it up" and do another biopsy.  He says he has had CT scans and is scheduled for  MRI scans.  Discussed labs with pt- that he needs to be on statin but the interactions too great with him still taking the diflucan.  He was counseled to eat low-fat diet but he says he isn't.  Also, he is still smoking.   Relevant past medical, surgical, family and social history reviewed and updated as indicated. Interim medical history since our last visit reviewed. Allergies and medications reviewed and updated.   Current outpatient prescriptions:  .  acetaminophen (TYLENOL) 500 MG tablet, Take 1,000 mg by mouth every 6 (six) hours as needed for moderate pain., Disp: , Rfl:  .  fluconazole (DIFLUCAN) 200 MG tablet, Take 200 mg by mouth 3 (three) times daily., Disp: , Rfl:  .  gabapentin (NEURONTIN) 300 MG capsule, Take 300 mg by mouth 3 (three) times daily., Disp: , Rfl:  .  ibuprofen (ADVIL,MOTRIN) 200 MG tablet, Take 400 mg by mouth every 6 (six) hours as needed for moderate pain., Disp: , Rfl:    Review of Systems  Constitutional: Positive for diaphoresis and fatigue. Negative for fever, chills, appetite change and unexpected weight change.  HENT: Positive for congestion, dental problem and hearing loss. Negative for drooling, ear pain, facial swelling, mouth sores, sneezing, sore throat, trouble swallowing and voice change.   Eyes: Positive for discharge. Negative for pain, redness, itching and visual disturbance.   Respiratory: Positive for shortness of breath and wheezing. Negative for cough and choking.   Cardiovascular: Negative for chest pain, palpitations and leg swelling.  Gastrointestinal: Negative for vomiting, abdominal pain, diarrhea, constipation and blood in stool.  Endocrine: Positive for cold intolerance and polydipsia. Negative for heat intolerance.  Genitourinary: Negative for dysuria, hematuria and decreased urine volume.  Musculoskeletal: Positive for back pain. Negative for arthralgias and gait problem.  Skin: Negative for rash.  Allergic/Immunologic: Positive for environmental allergies.  Neurological: Negative for seizures, syncope, light-headedness and headaches.  Hematological: Negative for adenopathy.  Psychiatric/Behavioral: Positive for agitation. Negative for suicidal ideas and dysphoric mood. The patient is not nervous/anxious.     Per HPI unless specifically indicated above     Objective:    BP 130/82 mmHg  Pulse 70  Temp(Src) 97.7 F (36.5 C)  Ht 5' 9.5" (1.765 m)  Wt 202 lb 3.2 oz (91.717 kg)  BMI 29.44 kg/m2  SpO2 96%  Wt Readings from Last 3 Encounters:  11/07/15 202 lb 3.2 oz (91.717 kg)  08/08/15 202 lb (91.627 kg)  05/08/15 188 lb (85.276 kg)    Physical Exam  Constitutional: He is oriented to person, place, and time. He appears well-developed and well-nourished.  HENT:  Head: Normocephalic and atraumatic.  Neck: Neck supple.  Cardiovascular: Normal rate and regular rhythm.   Pulmonary/Chest: Effort normal and  breath sounds normal. He has no wheezes. He exhibits mass and tenderness.  Persistent mass L chest wall- oozing small amount of fluid.  Abdominal: Soft. Bowel sounds are normal. There is no hepatosplenomegaly. There is no tenderness.  Musculoskeletal: He exhibits no edema.  Lymphadenopathy:    He has no cervical adenopathy.  Neurological: He is alert and oriented to person, place, and time.  Skin: Skin is warm and dry.  Psychiatric: He has  a normal mood and affect. His behavior is normal.  Vitals reviewed.   Results for orders placed or performed in visit on 08/10/15  CBC with Differential/Platelet  Result Value Ref Range   WBC 5.9 3.8 - 10.8 K/uL   RBC 4.32 4.20 - 5.80 MIL/uL   Hemoglobin 13.0 (L) 13.2 - 17.1 g/dL   HCT 38.7 38.5 - 50.0 %   MCV 89.6 80.0 - 100.0 fL   MCH 30.1 27.0 - 33.0 pg   MCHC 33.6 32.0 - 36.0 g/dL   RDW 13.9 11.0 - 15.0 %   Platelets 301 140 - 400 K/uL   MPV 9.9 7.5 - 12.5 fL   Neutro Abs 2655 1500 - 7800 cells/uL   Lymphs Abs 2360 850 - 3900 cells/uL   Monocytes Absolute 531 200 - 950 cells/uL   Eosinophils Absolute 295 15 - 500 cells/uL   Basophils Absolute 59 0 - 200 cells/uL   Neutrophils Relative % 45 %   Lymphocytes Relative 40 %   Monocytes Relative 9 %   Eosinophils Relative 5 %   Basophils Relative 1 %   Smear Review Criteria for review not met   Lipid panel  Result Value Ref Range   Cholesterol 222 (H) 125 - 200 mg/dL   Triglycerides 222 (H) <150 mg/dL   HDL 42 >=40 mg/dL   Total CHOL/HDL Ratio 5.3 (H) <=5.0 Ratio   VLDL 44 (H) <30 mg/dL   LDL Cholesterol 136 (H) <130 mg/dL      Assessment & Plan:   Encounter Diagnoses  Name Primary?  . Mass of chest wall, left Yes  . Nicotine dependence, cigarettes, with other nicotine-induced disorders   . Hyperlipidemia     -counseled on cholesterol as above. Gave lowfat diet.  Will likely add statin when he is finished with the diflucan -counseled on smoking cessation -continue with [facility] for evaluation and treatment of chest wall mass -f/u here 3 months.  RTO sooner prn

## 2015-11-07 NOTE — Patient Instructions (Signed)

## 2016-01-29 ENCOUNTER — Other Ambulatory Visit: Payer: Self-pay

## 2016-01-29 DIAGNOSIS — E785 Hyperlipidemia, unspecified: Secondary | ICD-10-CM

## 2016-02-07 ENCOUNTER — Encounter: Payer: Self-pay | Admitting: Physician Assistant

## 2016-02-07 ENCOUNTER — Ambulatory Visit: Payer: Self-pay | Admitting: Physician Assistant

## 2016-02-07 VITALS — BP 108/76 | HR 82 | Temp 97.5°F | Ht 69.5 in | Wt 204.3 lb

## 2016-02-07 DIAGNOSIS — R222 Localized swelling, mass and lump, trunk: Secondary | ICD-10-CM

## 2016-02-07 DIAGNOSIS — J449 Chronic obstructive pulmonary disease, unspecified: Secondary | ICD-10-CM | POA: Insufficient documentation

## 2016-02-07 DIAGNOSIS — E785 Hyperlipidemia, unspecified: Secondary | ICD-10-CM

## 2016-02-07 DIAGNOSIS — F17218 Nicotine dependence, cigarettes, with other nicotine-induced disorders: Secondary | ICD-10-CM

## 2016-02-07 DIAGNOSIS — Z862 Personal history of diseases of the blood and blood-forming organs and certain disorders involving the immune mechanism: Secondary | ICD-10-CM

## 2016-02-07 MED ORDER — ALBUTEROL SULFATE HFA 108 (90 BASE) MCG/ACT IN AERS: 2.0000 | INHALATION_SPRAY | Freq: Four times a day (QID) | RESPIRATORY_TRACT | 2 refills | Status: DC | PRN

## 2016-02-07 NOTE — Progress Notes (Signed)
BP 108/76 (BP Location: Left Arm, Patient Position: Sitting, Cuff Size: Normal)   Pulse 82   Temp 97.5 F (36.4 C) (Other (Comment))   Ht 5' 9.5" (1.765 m)   Wt 204 lb 4.8 oz (92.7 kg)   SpO2 95%   BMI 29.74 kg/m    Subjective:    Patient ID: Gene Smith, male    DOB: 06/19/1965, 50 y.o.   MRN: [id number]  HPI: Gene CoryDavid E Mosqueda is a 50 y.o. male presenting on 02/07/2016 for Hyperlipidemia (is waiting to get Medicaid card)   HPI   Pt is still going to [facility] the mass on his chest wall.  He says they took part of his ribs out on the Left.  He is still smoking.  He got medicaid. Says he just got letter in the mail Saturday and hasn't yet received a card.   Pt got his bloodwork drawn this morning.   Relevant past medical, surgical, family and social history reviewed and updated as indicated. Interim medical history since our last visit reviewed. Allergies and medications reviewed and updated.   Current Outpatient Prescriptions:  .  acetaminophen (TYLENOL) 500 MG tablet, Take 1,000 mg by mouth every 6 (six) hours as needed for moderate pain., Disp: , Rfl:  .  AMOXICILLIN PO, Take 2 tablets by mouth 3 (three) times daily., Disp: , Rfl:  .  gabapentin (NEURONTIN) 300 MG capsule, Take 300 mg by mouth 3 (three) times daily. 2 qAM, 1@lunch , 2qhs, Disp: , Rfl:  .  ibuprofen (ADVIL,MOTRIN) 200 MG tablet, Take 400 mg by mouth every 6 (six) hours as needed for moderate pain., Disp: , Rfl:  .  METHADONE HCL PO, Take 130 mg by mouth daily., Disp: , Rfl:    Review of Systems  Constitutional: Positive for appetite change. Negative for chills, diaphoresis, fatigue, fever and unexpected weight change.  HENT: Positive for dental problem. Negative for congestion, drooling, ear pain, facial swelling, hearing loss, mouth sores, sneezing, sore throat, trouble swallowing and voice change.   Eyes: Negative for pain, discharge, redness, itching and visual disturbance.  Respiratory: Positive for  shortness of breath and wheezing. Negative for cough and choking.   Cardiovascular: Negative for chest pain, palpitations and leg swelling.  Gastrointestinal: Negative for abdominal pain, blood in stool, constipation, diarrhea and vomiting.  Endocrine: Positive for cold intolerance and heat intolerance. Negative for polydipsia.  Genitourinary: Negative for decreased urine volume, dysuria and hematuria.  Musculoskeletal: Positive for arthralgias and back pain. Negative for gait problem.  Skin: Negative for rash.  Allergic/Immunologic: Positive for environmental allergies.  Neurological: Negative for seizures, syncope, light-headedness and headaches.  Hematological: Negative for adenopathy.  Psychiatric/Behavioral: Positive for agitation. Negative for dysphoric mood and suicidal ideas. The patient is not nervous/anxious.     Per HPI unless specifically indicated above     Objective:    BP 108/76 (BP Location: Left Arm, Patient Position: Sitting, Cuff Size: Normal)   Pulse 82   Temp 97.5 F (36.4 C) (Other (Comment))   Ht 5' 9.5" (1.765 m)   Wt 204 lb 4.8 oz (92.7 kg)   SpO2 95%   BMI 29.74 kg/m   Wt Readings from Last 3 Encounters:  02/07/16 204 lb 4.8 oz (92.7 kg)  11/07/15 202 lb 3.2 oz (91.7 kg)  08/08/15 202 lb (91.6 kg)    Physical Exam  Constitutional: He is oriented to person, place, and time. He appears well-developed and well-nourished.  HENT:  Head: Normocephalic and atraumatic.  Neck: Neck supple.  Cardiovascular: Normal rate and regular rhythm.   Pulmonary/Chest: Effort normal. No respiratory distress. He has wheezes. He has no rhonchi. He has no rales.  Soft scattered expiratory wheezes Scar with open healing area Left chest wall.  No redness of the skin.  No swelling.   Abdominal: Soft. Bowel sounds are normal. There is no hepatosplenomegaly. There is no tenderness.  Musculoskeletal: He exhibits no edema.  Lymphadenopathy:    He has no cervical adenopathy.   Neurological: He is alert and oriented to person, place, and time.  Skin: Skin is warm and dry.  Psychiatric: He has a normal mood and affect. His behavior is normal.  Vitals reviewed.       Assessment & Plan:   Encounter Diagnoses  Name Primary?  . Chronic obstructive pulmonary disease, unspecified COPD type (HCC) Yes  . Nicotine dependence, cigarettes, with other nicotine-induced disorders   . Hyperlipidemia, unspecified hyperlipidemia type   . Mass of chest wall, left      -When gets lab results, will start lipitor.  Will call with results. Pt reminded to follow a lowfat diet -rx albuterol mdi -counseled on smoking cessation -pt to continue with [facility] for chest wall mass -follow up in 3 months.  RTO sooner if needed

## 2016-02-07 NOTE — Patient Instructions (Signed)
Smoking Cessation, Tips for Success If you are ready to quit smoking, congratulations! You have chosen to help yourself be healthier. Cigarettes bring nicotine, tar, carbon monoxide, and other irritants into your body. Your lungs, heart, and blood vessels will be able to work better without these poisons. There are many different ways to quit smoking. Nicotine gum, nicotine patches, a nicotine inhaler, or nicotine nasal spray can help with physical craving. Hypnosis, support groups, and medicines help break the habit of smoking. WHAT THINGS CAN I DO TO MAKE QUITTING EASIER?  Here are some tips to help you quit for good:  Pick a date when you will quit smoking completely. Tell all of your friends and family about your plan to quit on that date.  Do not try to slowly cut down on the number of cigarettes you are smoking. Pick a quit date and quit smoking completely starting on that day.  Throw away all cigarettes.   Clean and remove all ashtrays from your home, work, and car.  On a card, write down your reasons for quitting. Carry the card with you and read it when you get the urge to smoke.  Cleanse your body of nicotine. Drink enough water and fluids to keep your urine clear or pale yellow. Do this after quitting to flush the nicotine from your body.  Learn to predict your moods. Do not let a bad situation be your excuse to have a cigarette. Some situations in your life might tempt you into wanting a cigarette.  Never have "just one" cigarette. It leads to wanting another and another. Remind yourself of your decision to quit.  Change habits associated with smoking. If you smoked while driving or when feeling stressed, try other activities to replace smoking. Stand up when drinking your coffee. Brush your teeth after eating. Sit in a different chair when you read the paper. Avoid alcohol while trying to quit, and try to drink fewer caffeinated beverages. Alcohol and caffeine may urge you to  smoke.  Avoid foods and drinks that can trigger a desire to smoke, such as sugary or spicy foods and alcohol.  Ask people who smoke not to smoke around you.  Have something planned to do right after eating or having a cup of coffee. For example, plan to take a walk or exercise.  Try a relaxation exercise to calm you down and decrease your stress. Remember, you may be tense and nervous for the first 2 weeks after you quit, but this will pass.  Find new activities to keep your hands busy. Play with a pen, coin, or rubber band. Doodle or draw things on paper.  Brush your teeth right after eating. This will help cut down on the craving for the taste of tobacco after meals. You can also try mouthwash.   Use oral substitutes in place of cigarettes. Try using lemon drops, carrots, cinnamon sticks, or chewing gum. Keep them handy so they are available when you have the urge to smoke.  When you have the urge to smoke, try deep breathing.  Designate your home as a nonsmoking area.  If you are a heavy smoker, ask your health care provider about a prescription for nicotine chewing gum. It can ease your withdrawal from nicotine.  Reward yourself. Set aside the cigarette money you save and buy yourself something nice.  Look for support from others. Join a support group or smoking cessation program. Ask someone at home or at work to help you with your plan   to quit smoking.  Always ask yourself, "Do I need this cigarette or is this just a reflex?" Tell yourself, "Today, I choose not to smoke," or "I do not want to smoke." You are reminding yourself of your decision to quit.  Do not replace cigarette smoking with electronic cigarettes (commonly called e-cigarettes). The safety of e-cigarettes is unknown, and some may contain harmful chemicals.  If you relapse, do not give up! Plan ahead and think about what you will do the next time you get the urge to smoke. HOW WILL I FEEL WHEN I QUIT SMOKING? You  may have symptoms of withdrawal because your body is used to nicotine (the addictive substance in cigarettes). You may crave cigarettes, be irritable, feel very hungry, cough often, get headaches, or have difficulty concentrating. The withdrawal symptoms are only temporary. They are strongest when you first quit but will go away within 10-14 days. When withdrawal symptoms occur, stay in control. Think about your reasons for quitting. Remind yourself that these are signs that your body is healing and getting used to being without cigarettes. Remember that withdrawal symptoms are easier to treat than the major diseases that smoking can cause.  Even after the withdrawal is over, expect periodic urges to smoke. However, these cravings are generally short lived and will go away whether you smoke or not. Do not smoke! WHAT RESOURCES ARE AVAILABLE TO HELP ME QUIT SMOKING? Your health care provider can direct you to community resources or hospitals for support, which may include:  Group support.  Education.  Hypnosis.  Therapy.   This information is not intended to replace advice given to you by your health care provider. Make sure you discuss any questions you have with your health care provider.   Document Released: 01/04/2004 Document Revised: 04/28/2014 Document Reviewed: 09/23/2012 Elsevier Interactive Patient Education 2016 [employer] Inc[employer]  

## 2016-02-08 LAB — COMPLETE METABOLIC PANEL WITH GFR
ALT: 35 U/L (ref 9–46)
AST: 52 U/L — AB (ref 10–35)
Albumin: 4 g/dL (ref 3.6–5.1)
Alkaline Phosphatase: 76 U/L (ref 40–115)
BILIRUBIN TOTAL: 0.4 mg/dL (ref 0.2–1.2)
BUN: 6 mg/dL — AB (ref 7–25)
CO2: 23 mmol/L (ref 20–31)
CREATININE: 0.65 mg/dL — AB (ref 0.70–1.33)
Calcium: 8.9 mg/dL (ref 8.6–10.3)
Chloride: 105 mmol/L (ref 98–110)
GFR, Est African American: >89 mL/min (ref >=60)
GFR, Est Non African American: >89 mL/min (ref >=60)
GLUCOSE: 90 mg/dL (ref 65–99)
Potassium: 4.2 mmol/L (ref 3.5–5.3)
SODIUM: 138 mmol/L (ref 135–146)
TOTAL PROTEIN: 7.4 g/dL (ref 6.1–8.1)

## 2016-02-08 LAB — LIPID PANEL
Cholesterol: 178 mg/dL (ref 125–200)
HDL: 45 mg/dL (ref >=40)
LDL Cholesterol: 110 mg/dL (ref <130)
TRIGLYCERIDES: 115 mg/dL (ref <150)
Total CHOL/HDL Ratio: 4 Ratio (ref <=5.0)
VLDL: 23 mg/dL (ref <30)

## 2016-04-08 ENCOUNTER — Other Ambulatory Visit: Payer: Self-pay | Admitting: Physician Assistant

## 2016-04-08 MED ORDER — ALBUTEROL SULFATE HFA 108 (90 BASE) MCG/ACT IN AERS: 2.0000 | INHALATION_SPRAY | Freq: Four times a day (QID) | RESPIRATORY_TRACT | 2 refills | Status: DC | PRN

## 2016-04-29 ENCOUNTER — Other Ambulatory Visit: Payer: Self-pay | Admitting: Student

## 2016-04-29 DIAGNOSIS — Z862 Personal history of diseases of the blood and blood-forming organs and certain disorders involving the immune mechanism: Secondary | ICD-10-CM

## 2016-04-29 DIAGNOSIS — E785 Hyperlipidemia, unspecified: Secondary | ICD-10-CM

## 2016-05-05 LAB — LIPID PANEL
CHOL/HDL RATIO: 5.4 ratio — AB (ref <5.0)
CHOLESTEROL: 227 mg/dL — AB (ref <200)
HDL: 42 mg/dL (ref >40)
LDL CALC: 140 mg/dL — AB (ref <100)
Triglycerides: 223 mg/dL — ABNORMAL HIGH (ref <150)
VLDL: 45 mg/dL — AB (ref <30)

## 2016-05-05 LAB — HEMOGLOBIN: Hemoglobin: 14.4 g/dL (ref 13.2–17.1)

## 2016-05-06 LAB — COMPREHENSIVE METABOLIC PANEL
ALT: 29 U/L (ref 9–46)
AST: 29 U/L (ref 10–35)
Albumin: 4.3 g/dL (ref 3.6–5.1)
Alkaline Phosphatase: 71 U/L (ref 40–115)
BUN: 13 mg/dL (ref 7–25)
CHLORIDE: 104 mmol/L (ref 98–110)
CO2: 25 mmol/L (ref 20–31)
Calcium: 9.4 mg/dL (ref 8.6–10.3)
Creat: 0.77 mg/dL (ref 0.70–1.33)
GLUCOSE: 104 mg/dL — AB (ref 65–99)
POTASSIUM: 4.5 mmol/L (ref 3.5–5.3)
Sodium: 137 mmol/L (ref 135–146)
Total Bilirubin: 0.3 mg/dL (ref 0.2–1.2)
Total Protein: 7.9 g/dL (ref 6.1–8.1)

## 2016-05-08 ENCOUNTER — Ambulatory Visit: Payer: Self-pay | Admitting: Physician Assistant

## 2016-05-13 ENCOUNTER — Encounter: Payer: Self-pay | Admitting: Physician Assistant

## 2016-05-13 ENCOUNTER — Ambulatory Visit: Payer: Self-pay | Admitting: Physician Assistant

## 2016-05-13 VITALS — BP 118/80 | HR 60 | Ht 69.5 in | Wt 221.0 lb

## 2016-05-13 DIAGNOSIS — G8929 Other chronic pain: Secondary | ICD-10-CM

## 2016-05-13 DIAGNOSIS — F17218 Nicotine dependence, cigarettes, with other nicotine-induced disorders: Secondary | ICD-10-CM

## 2016-05-13 DIAGNOSIS — J449 Chronic obstructive pulmonary disease, unspecified: Secondary | ICD-10-CM

## 2016-05-13 DIAGNOSIS — Z125 Encounter for screening for malignant neoplasm of prostate: Secondary | ICD-10-CM

## 2016-05-13 DIAGNOSIS — E785 Hyperlipidemia, unspecified: Secondary | ICD-10-CM

## 2016-05-13 DIAGNOSIS — F1121 Opioid dependence, in remission: Secondary | ICD-10-CM | POA: Insufficient documentation

## 2016-05-13 MED ORDER — MOMETASONE FURO-FORMOTEROL FUM 100-5 MCG/ACT IN AERO: 2.0000 | INHALATION_SPRAY | Freq: Two times a day (BID) | RESPIRATORY_TRACT | 4 refills | Status: DC

## 2016-05-13 MED ORDER — ATORVASTATIN CALCIUM 20 MG PO TABS: 20.0000 mg | ORAL_TABLET | Freq: Every day | ORAL | 4 refills | Status: DC

## 2016-05-13 NOTE — Patient Instructions (Signed)
Fat and Cholesterol Restricted Diet High levels of fat and cholesterol in your blood may lead to various health problems, such as diseases of the heart, blood vessels, gallbladder, liver, and pancreas. Fats are concentrated sources of energy that come in various forms. Certain types of fat, including saturated fat, may be harmful in excess. Cholesterol is a substance needed by your body in small amounts. Your body makes all the cholesterol it needs. Excess cholesterol comes from the food you eat. When you have high levels of cholesterol and saturated fat in your blood, health problems can develop because the excess fat and cholesterol will gather along the walls of your blood vessels, causing them to narrow. Choosing the right foods will help you control your intake of fat and cholesterol. This will help keep the levels of these substances in your blood within normal limits and reduce your risk of disease. What is my plan? Your health care provider recommends that you:  Limit your fat intake to ______% or less of your total calories per day.  Limit the amount of cholesterol in your diet to less than _________mg per day.  Eat 20-30 grams of fiber each day.  What types of fat should I choose?  Choose healthy fats more often. Choose monounsaturated and polyunsaturated fats, such as olive and canola oil, flaxseeds, walnuts, almonds, and seeds.  Eat more omega-3 fats. Good choices include salmon, mackerel, sardines, tuna, flaxseed oil, and ground flaxseeds. Aim to eat fish at least two times a week.  Limit saturated fats. Saturated fats are primarily found in animal products, such as meats, butter, and cream. Plant sources of saturated fats include palm oil, palm kernel oil, and coconut oil.  Avoid foods with partially hydrogenated oils in them. These contain trans fats. Examples of foods that contain trans fats are stick margarine, some tub margarines, cookies, crackers, and other baked goods. What  general guidelines do I need to follow? These guidelines for healthy eating will help you control your intake of fat and cholesterol:  Check food labels carefully to identify foods with trans fats or high amounts of saturated fat.  Fill one half of your plate with vegetables and green salads.  Fill one fourth of your plate with whole grains. Look for the word "whole" as the first word in the ingredient list.  Fill one fourth of your plate with lean protein foods.  Limit fruit to two servings a day. Choose fruit instead of juice.  Eat more foods that contain fiber, such as apples, broccoli, carrots, beans, peas, and barley.  Eat more home-cooked food and less restaurant, buffet, and fast food.  Limit or avoid alcohol.  Limit foods high in starch and sugar.  Limit fried foods.  Cook foods using methods other than frying. Baking, boiling, grilling, and broiling are all great options.  Lose weight if you are overweight. Losing just 5-10% of your initial body weight can help your overall health and prevent diseases such as diabetes and heart disease.  What foods can I eat? Grains  Whole grains, such as whole wheat or whole grain breads, crackers, cereals, and pasta. Unsweetened oatmeal, bulgur, barley, quinoa, or brown rice. Corn or whole wheat flour tortillas. Vegetables  Fresh or frozen vegetables (raw, steamed, roasted, or grilled). Green salads. Fruits  All fresh, canned (in natural juice), or frozen fruits. Meats and other protein foods  Ground beef (85% or leaner), grass-fed beef, or beef trimmed of fat. Skinless chicken or turkey. Ground chicken or turkey.   Pork trimmed of fat. All fish and seafood. Eggs. Dried beans, peas, or lentils. Unsalted nuts or seeds. Unsalted canned or dry beans. Dairy  Low-fat dairy products, such as skim or 1% milk, 2% or reduced-fat cheeses, low-fat ricotta or cottage cheese, or plain low-fat yo Fats and oils  Tub margarines without trans  fats. Light or reduced-fat mayonnaise and salad dressings. Avocado. Olive, canola, sesame, or safflower oils. Natural peanut or almond butter (choose ones without added sugar and oil). The items listed above may not be a complete list of recommended foods or beverages. Contact your dietitian for more options. Foods to avoid Grains  White bread. White pasta. White rice. Cornbread. Bagels, pastries, and croissants. Crackers that contain trans fat. Vegetables  White potatoes. Corn. Creamed or fried vegetables. Vegetables in a cheese sauce. Fruits  Dried fruits. Canned fruit in light or heavy syrup. Fruit juice. Meats and other protein foods  Fatty cuts of meat. Ribs, chicken wings, bacon, sausage, bologna, salami, chitterlings, fatback, hot dogs, bratwurst, and packaged luncheon meats. Liver and organ meats. Dairy  Whole or 2% milk, cream, half-and-half, and cream cheese. Whole milk cheeses. Whole-fat or sweetened yogurt. Full-fat cheeses. Nondairy creamers and whipped toppings. Processed cheese, cheese spreads, or cheese curds. Beverages  Alcohol. Sweetened drinks (such as sodas, lemonade, and fruit drinks or punches). Fats and oils  Butter, stick margarine, lard, shortening, ghee, or bacon fat. Coconut, palm kernel, or palm oils. Sweets and desserts  Corn syrup, sugars, honey, and molasses. Candy. Jam and jelly. Syrup. Sweetened cereals. Cookies, pies, cakes, donuts, muffins, and ice cream. The items listed above may not be a complete list of foods and beverages to avoid. Contact your dietitian for more information. This information is not intended to replace advice given to you by your health care provider. Make sure you discuss any questions you have with your health care provider. Document Released: 04/07/2005 Document Revised: 04/28/2014 Document Reviewed: 07/06/2013 Elsevier Interactive Patient Education  2017 [employer] Inc.  

## 2016-05-13 NOTE — Progress Notes (Signed)
BP 118/80 (BP Location: Left Arm, Patient Position: Sitting, Cuff Size: Large)   Pulse 60   Ht 5' 9.5" (1.765 m)   Wt 221 lb (100.2 kg)   SpO2 99%   BMI 32.17 kg/m    Subjective:    Patient ID: Gene Smith, male    DOB: 08/31/1965, 51 y.o.   MRN: [id number]  HPI: Gene Smith is a 51 y.o. male presenting on 05/13/2016 for Hyperlipidemia and COPD   HPI   Pt says [facility] to his chest wall mass  He is still smoking.  He is using his inhaler 3-4 times every day.    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 Interim medical history since our last visit reviewed. Allergies and medications reviewed and updated.   Current Outpatient Prescriptions:  .  acetaminophen (TYLENOL) 500 MG tablet, Take 1,000 mg by mouth every 6 (six) hours as needed for moderate pain., Disp: , Rfl:  .  albuterol (PROVENTIL HFA;VENTOLIN HFA) 108 (90 Base) MCG/ACT inhaler, Inhale 2 puffs into the lungs every 6 (six) hours as needed for wheezing or shortness of breath., Disp: 1 Inhaler, Rfl: 2 .  gabapentin (NEURONTIN) 300 MG capsule, Take 300 mg by mouth 3 (three) times daily. 2 qAM, 1@lunch , 2qhs, Disp: , Rfl:  .  ibuprofen (ADVIL,MOTRIN) 200 MG tablet, Take 400 mg by mouth every 6 (six) hours as needed for moderate pain., Disp: , Rfl:  .  METHADONE HCL PO, Take 140 mg by mouth daily. , Disp: , Rfl:    Review of Systems  Constitutional: Positive for appetite change, chills, diaphoresis, fatigue and unexpected weight change. Negative for fever.  HENT: Positive for congestion, dental problem, hearing loss and mouth sores. Negative for drooling, ear pain, facial swelling, sneezing, sore throat, trouble swallowing and voice change.   Eyes: Negative for pain, discharge, redness, itching and visual disturbance.  Respiratory:  Positive for cough, shortness of breath and wheezing. Negative for choking.   Cardiovascular: Positive for chest pain and palpitations. Negative for leg swelling.  Gastrointestinal: Negative for abdominal pain, blood in stool, constipation, diarrhea and vomiting.  Endocrine: Positive for cold intolerance, heat intolerance and polydipsia.  Genitourinary: Positive for decreased urine volume. Negative for dysuria and hematuria.  Musculoskeletal: Positive for arthralgias and back pain. Negative for gait problem.  Skin: Negative for rash.  Allergic/Immunologic: Negative for environmental allergies.  Neurological: Negative for seizures, syncope, light-headedness and headaches.  Hematological: Negative for adenopathy.  Psychiatric/Behavioral: Positive for agitation and dysphoric mood. Negative for suicidal ideas. The patient is nervous/anxious.     Per HPI unless specifically indicated above     Objective:    BP 118/80 (BP Location: Left Arm, Patient Position: Sitting, Cuff Size: Large)   Pulse 60   Ht 5' 9.5" (1.765 m)   Wt 221 lb (100.2 kg)   SpO2 99%   BMI 32.17 kg/m   Wt Readings from Last 3 Encounters:  05/13/16 221 lb (100.2 kg)  02/07/16 204 lb 4.8 oz (92.7 kg)  11/07/15 202 lb 3.2 oz (91.7 kg)    Physical Exam  Constitutional: He is oriented to person, place, and time. He appears well-developed and well-nourished.  HENT:  Head: Normocephalic and atraumatic.  Neck: Neck supple.  Cardiovascular: Normal rate and regular rhythm.   Pulmonary/Chest: Effort normal and breath  sounds normal. No tachypnea and no bradypnea. No respiratory distress. He has no decreased breath sounds. He has no wheezes. He has no rhonchi. He has no rales.  Large left anterior chest wall scar with non-point tenderness.  There is no redness or drainage.  Abdominal: Soft. Bowel sounds are normal. There is no hepatosplenomegaly. There is no tenderness.  Musculoskeletal: He exhibits no edema.  Lymphadenopathy:     He has no cervical adenopathy.  Neurological: He is alert and oriented to person, place, and time.  Skin: Skin is warm and dry.  Psychiatric: He has a normal mood and affect. His behavior is normal.  Vitals reviewed.   Results for orders placed or performed in visit on 04/29/16  Hemoglobin  Result Value Ref Range   Hemoglobin 14.4 13.2 - 17.1 g/dL  Comprehensive Metabolic Panel (CMET)  Result Value Ref Range   Sodium 137 135 - 146 mmol/L   Potassium 4.5 3.5 - 5.3 mmol/L   Chloride 104 98 - 110 mmol/L   CO2 25 20 - 31 mmol/L   Glucose, Bld 104 (H) 65 - 99 mg/dL   BUN 13 7 - 25 mg/dL   Creat 1.61 0.96 - 0.45 mg/dL   Total Bilirubin 0.3 0.2 - 1.2 mg/dL   Alkaline Phosphatase 71 40 - 115 U/L   AST 29 10 - 35 U/L   ALT 29 9 - 46 U/L   Total Protein 7.9 6.1 - 8.1 g/dL   Albumin 4.3 3.6 - 5.1 g/dL   Calcium 9.4 8.6 - 40.9 mg/dL  Lipid Profile  Result Value Ref Range   Cholesterol 227 (H) <200 mg/dL   Triglycerides 811 (H) <150 mg/dL   HDL 42 >91 mg/dL   Total CHOL/HDL Ratio 5.4 (H) <5.0 Ratio   VLDL 45 (H) <30 mg/dL   LDL Cholesterol 478 (H) <100 mg/dL      Assessment & Plan:   Encounter Diagnoses  Name Primary?  . Chronic obstructive pulmonary disease, unspecified COPD type (HCC) Yes  . Nicotine dependence, cigarettes, with other nicotine-induced disorders   . Hyperlipidemia, unspecified hyperlipidemia type   . Other chronic pain   . History of narcotic addiction (HCC)   . Screening for prostate cancer    -reviewed labs with pt -Can cont gabapentin current for now will be decreasing in the future -rx atorvastatin for hyperlipidemia.  Counseled lowfat diet -counseled smoking cessation -add dulera daily and counseled on use of albuterol as rescue inhaler -follow up in 3 months.  RTO sooner prn

## 2016-08-05 ENCOUNTER — Other Ambulatory Visit: Payer: Self-pay

## 2016-08-05 DIAGNOSIS — E785 Hyperlipidemia, unspecified: Secondary | ICD-10-CM

## 2016-08-05 DIAGNOSIS — Z125 Encounter for screening for malignant neoplasm of prostate: Secondary | ICD-10-CM

## 2016-08-07 LAB — COMPREHENSIVE METABOLIC PANEL
ALK PHOS: 70 U/L (ref 40–115)
ALT: 43 U/L (ref 9–46)
AST: 36 U/L — ABNORMAL HIGH (ref 10–35)
Albumin: 4.1 g/dL (ref 3.6–5.1)
BUN: 12 mg/dL (ref 7–25)
CHLORIDE: 104 mmol/L (ref 98–110)
CO2: 26 mmol/L (ref 20–31)
Calcium: 9 mg/dL (ref 8.6–10.3)
Creat: 0.68 mg/dL — ABNORMAL LOW (ref 0.70–1.33)
GLUCOSE: 88 mg/dL (ref 65–99)
POTASSIUM: 4.3 mmol/L (ref 3.5–5.3)
SODIUM: 138 mmol/L (ref 135–146)
Total Bilirubin: 0.4 mg/dL (ref 0.2–1.2)
Total Protein: 7.2 g/dL (ref 6.1–8.1)

## 2016-08-07 LAB — LIPID PANEL
CHOL/HDL RATIO: 3.3 ratio (ref <5.0)
Cholesterol: 143 mg/dL (ref <200)
HDL: 43 mg/dL (ref >40)
LDL Cholesterol: 72 mg/dL (ref <100)
Triglycerides: 140 mg/dL (ref <150)
VLDL: 28 mg/dL (ref <30)

## 2016-08-08 LAB — PSA

## 2016-08-12 ENCOUNTER — Encounter: Payer: Self-pay | Admitting: Physician Assistant

## 2016-08-12 ENCOUNTER — Ambulatory Visit: Payer: Self-pay | Admitting: Physician Assistant

## 2016-08-12 VITALS — BP 118/80 | HR 92 | Ht 69.5 in | Wt 223.2 lb

## 2016-08-12 DIAGNOSIS — Z125 Encounter for screening for malignant neoplasm of prostate: Secondary | ICD-10-CM

## 2016-08-12 DIAGNOSIS — F17218 Nicotine dependence, cigarettes, with other nicotine-induced disorders: Secondary | ICD-10-CM

## 2016-08-12 DIAGNOSIS — E785 Hyperlipidemia, unspecified: Secondary | ICD-10-CM

## 2016-08-12 DIAGNOSIS — J449 Chronic obstructive pulmonary disease, unspecified: Secondary | ICD-10-CM

## 2016-08-12 MED ORDER — MOMETASONE FURO-FORMOTEROL FUM 100-5 MCG/ACT IN AERO: 2.0000 | INHALATION_SPRAY | Freq: Two times a day (BID) | RESPIRATORY_TRACT | 4 refills | Status: DC

## 2016-08-12 MED ORDER — ATORVASTATIN CALCIUM 20 MG PO TABS: 20.0000 mg | ORAL_TABLET | Freq: Every day | ORAL | 4 refills | Status: DC

## 2016-08-12 NOTE — Progress Notes (Signed)
BP 118/80 (BP Location: Left Arm, Patient Position: Sitting, Cuff Size: Normal)   Pulse 92   Ht 5' 9.5" (1.765 m)   Wt 223 lb 4 oz (101.3 kg)   SpO2 96%   BMI 32.50 kg/m    Subjective:    Patient ID: Gene Smith, male    DOB: 1966-02-08, 51 y.o.   MRN: [id number]  HPI: Gene Smith is a 51 y.o. male presenting on 08/12/2016 for COPD and Hyperlipidemia   HPI  Pt is still smoking  [facility] and [facility]  States chest wall doing okay. No recent problems with that  Relevant past medical, surgical, family and social history reviewed and updated as indicated. Interim medical history since our last visit reviewed. Allergies and medications reviewed and updated.   Current Outpatient Prescriptions:  .  acetaminophen (TYLENOL) 500 MG tablet, Take 1,000 mg by mouth every 6 (six) hours as needed for moderate pain., Disp: , Rfl:  .  albuterol (PROVENTIL HFA;VENTOLIN HFA) 108 (90 Base) MCG/ACT inhaler, Inhale 2 puffs into the lungs every 6 (six) hours as needed for wheezing or shortness of breath., Disp: 1 Inhaler, Rfl: 2 .  atorvastatin (LIPITOR) 20 MG tablet, Take 1 tablet (20 mg total) by mouth daily., Disp: 30 tablet, Rfl: 4 .  gabapentin (NEURONTIN) 300 MG capsule, Take 300 mg by mouth 3 (three) times daily. , Disp: , Rfl:  .  ibuprofen (ADVIL,MOTRIN) 200 MG tablet, Take 400 mg by mouth every 6 (six) hours as needed for moderate pain., Disp: , Rfl:  .  METHADONE HCL PO, Take 150 mg by mouth daily. , Disp: , Rfl:  .  mometasone-formoterol (DULERA) 100-5 MCG/ACT AERO, Inhale 2 puffs into the lungs 2 (two) times daily., Disp: 1 Inhaler, Rfl: 4   Review of Systems  Constitutional: Positive for appetite change, diaphoresis and fatigue. Negative for chills, fever and unexpected weight change.  HENT: Positive for congestion, dental problem, hearing loss, mouth sores and sneezing. Negative for drooling, ear pain, facial  swelling, sore throat, trouble swallowing and voice change.   Eyes: Positive for redness and itching. Negative for pain, discharge and visual disturbance.  Respiratory: Positive for cough, shortness of breath and wheezing. Negative for choking.   Cardiovascular: Negative for chest pain, palpitations and leg swelling.  Gastrointestinal: Negative for abdominal pain, blood in stool, constipation, diarrhea and vomiting.  Endocrine: Positive for polydipsia. Negative for cold intolerance and heat intolerance.  Genitourinary: Negative for decreased urine volume, dysuria and hematuria.  Musculoskeletal: Positive for arthralgias, back pain and gait problem.  Skin: Negative for rash.  Allergic/Immunologic: Positive for environmental allergies.  Neurological: Negative for seizures, syncope, light-headedness and headaches.  Hematological: Negative for adenopathy.  Psychiatric/Behavioral: Positive for agitation and dysphoric mood. Negative for suicidal ideas. The patient is nervous/anxious.     Per HPI unless specifically indicated above     Objective:    BP 118/80 (BP Location: Left Arm, Patient Position: Sitting, Cuff Size: Normal)   Pulse 92   Ht 5' 9.5" (1.765 m)   Wt 223 lb 4 oz (101.3 kg)   SpO2 96%   BMI 32.50 kg/m   Wt Readings from Last 3 Encounters:  08/12/16 223 lb 4 oz (101.3 kg)  05/13/16 221 lb (100.2 kg)  02/07/16 204 lb 4.8 oz (92.7 kg)    Physical Exam  Constitutional: He is oriented to person, place, and time. He appears well-developed and well-nourished.  HENT:  Head: Normocephalic and atraumatic.  Neck: Neck supple.  Cardiovascular: Normal rate and regular rhythm.   Pulmonary/Chest: Effort normal and breath sounds normal. He has no wheezes.  scarring L ant chest wall. No drainage or erythema.   Abdominal: Soft. Bowel sounds are normal. There is no hepatosplenomegaly. There is no tenderness.  Musculoskeletal: He exhibits no edema.  Lymphadenopathy:    He has no  cervical adenopathy.  Neurological: He is alert and oriented to person, place, and time.  Skin: Skin is warm and dry.  Psychiatric: He has a normal mood and affect. His behavior is normal.  Vitals reviewed.   Results for orders placed or performed in visit on 08/05/16  PSA  Result Value Ref Range   PSA <0.1 <=4.0 ng/mL  Comprehensive metabolic panel  Result Value Ref Range   Sodium 138 135 - 146 mmol/L   Potassium 4.3 3.5 - 5.3 mmol/L   Chloride 104 98 - 110 mmol/L   CO2 26 20 - 31 mmol/L   Glucose, Bld 88 65 - 99 mg/dL   BUN 12 7 - 25 mg/dL   Creat 1.61 (L) 0.96 - 1.33 mg/dL   Total Bilirubin 0.4 0.2 - 1.2 mg/dL   Alkaline Phosphatase 70 40 - 115 U/L   AST 36 (H) 10 - 35 U/L   ALT 43 9 - 46 U/L   Total Protein 7.2 6.1 - 8.1 g/dL   Albumin 4.1 3.6 - 5.1 g/dL   Calcium 9.0 8.6 - 04.5 mg/dL  Lipid panel  Result Value Ref Range   Cholesterol 143 <200 mg/dL   Triglycerides 409 <811 mg/dL   HDL 43 >91 mg/dL   Total CHOL/HDL Ratio 3.3 <5.0 Ratio   VLDL 28 <30 mg/dL   LDL Cholesterol 72 <478 mg/dL      Assessment & Plan:   Encounter Diagnoses  Name Primary?  . Hyperlipidemia, unspecified hyperlipidemia type Yes  . Chronic obstructive pulmonary disease, unspecified COPD type (HCC)   . Nicotine dependence, cigarettes, with other nicotine-induced disorders   . Screening for prostate cancer     -reviewed labs with pt -counseled smoking cessation.  He says he isn't really ready to quit yet -pt to continue current medications -pt to continue with counseling at [facility] -follow up 3 months for recheck lipids and copd. RTO sooner prn

## 2016-08-18 ENCOUNTER — Other Ambulatory Visit: Payer: Self-pay | Admitting: Physician Assistant

## 2016-08-18 MED ORDER — ALBUTEROL SULFATE HFA 108 (90 BASE) MCG/ACT IN AERS: 2.0000 | INHALATION_SPRAY | Freq: Four times a day (QID) | RESPIRATORY_TRACT | 2 refills | Status: DC | PRN

## 2016-11-11 ENCOUNTER — Ambulatory Visit: Payer: Self-pay | Admitting: Physician Assistant

## 2016-11-19 ENCOUNTER — Other Ambulatory Visit (HOSPITAL_COMMUNITY): Payer: Self-pay | Admitting: Family Medicine

## 2016-11-19 ENCOUNTER — Ambulatory Visit (HOSPITAL_COMMUNITY)
Admission: RE | Admit: 2016-11-19 | Discharge: 2016-11-19 | Disposition: A | Discharge status: Home / Self Care | Payer: Medicaid Other | Source: Ambulatory Visit | Attending: Family Medicine | Admitting: Family Medicine

## 2016-11-19 DIAGNOSIS — M795 Residual foreign body in soft tissue: Secondary | ICD-10-CM

## 2017-01-15 ENCOUNTER — Encounter (INDEPENDENT_AMBULATORY_CARE_PROVIDER_SITE_OTHER): Payer: Self-pay | Admitting: Internal Medicine

## 2017-01-15 ENCOUNTER — Encounter (INDEPENDENT_AMBULATORY_CARE_PROVIDER_SITE_OTHER): Payer: Self-pay

## 2017-02-09 ENCOUNTER — Ambulatory Visit (INDEPENDENT_AMBULATORY_CARE_PROVIDER_SITE_OTHER): Payer: Medicaid Other | Admitting: Internal Medicine

## 2017-02-09 ENCOUNTER — Encounter (INDEPENDENT_AMBULATORY_CARE_PROVIDER_SITE_OTHER): Payer: Self-pay | Admitting: Internal Medicine

## 2017-02-09 VITALS — BP 110/54 | HR 64 | Temp 97.8°F | Ht 70.0 in | Wt 232.4 lb

## 2017-02-09 DIAGNOSIS — B1920 Unspecified viral hepatitis C without hepatic coma: Secondary | ICD-10-CM

## 2017-02-09 NOTE — Patient Instructions (Addendum)
OV pending.  Labs today. Will schedule an US elastrography.

## 2017-02-09 NOTE — Progress Notes (Addendum)
   Subjective:    Patient ID: Gene Smith, male    DOB: 02/17/1966, 51 y.o.   MRN: [id number]  HPI Referred by Dr. Janna Archondiego for Hep C.  Has never been treated. This is a new diagnosis.  Hx of IV drug abuse years ago. Does not do IV drugs now.  No tattoos. No etoh. Desire tx for Hep C.  Appetite is good.  No weight loss. Has a BM daily.   Hx of osteomyelitis left ribs.   01/02/2017 Hep C quaint 119000. HIV undetected. Hep C antibody +  Takes Methadone dialy.  Review of Systems   Past Medical History:  Diagnosis Date  . Anxiety   . Arthritis   . Back abrasion   . Depression   . GERD (gastroesophageal reflux disease)   . Mass     No past surgical history on file.  Allergies  Allergen Reactions  . Pollen Extract Other (See Comments)    congestion    Current Outpatient Prescriptions on File Prior to Visit  Medication Sig Dispense Refill  . albuterol (PROVENTIL HFA;VENTOLIN HFA) 108 (90 Base) MCG/ACT inhaler Inhale 2 puffs into the lungs every 6 (six) hours as needed for wheezing or shortness of breath. 1 Inhaler 2  . atorvastatin (LIPITOR) 20 MG tablet Take 1 tablet (20 mg total) by mouth daily. 30 tablet 4  . gabapentin (NEURONTIN) 300 MG capsule Take 300 mg by mouth 3 (three) times daily.     Marland Kitchen. ibuprofen (ADVIL,MOTRIN) 200 MG tablet Take 400 mg by mouth every 6 (six) hours as needed for moderate pain.    Marland Kitchen. METHADONE HCL PO Take 160 mg by mouth daily.     . mometasone-formoterol (DULERA) 100-5 MCG/ACT AERO Inhale 2 puffs into the lungs 2 (two) times daily. 1 Inhaler 4   No current facility-administered medications on file prior to visit.         Objective:   Physical Exam Blood pressure (!) 110/54, pulse 64, temperature 97.8 F (36.6 C), height 5\' 10"  (1.778 m), weight 232 lb 6.4 oz (105.4 kg). Alert and oriented. Skin warm and dry. Oral mucosa is moist.   . Sclera anicteric, conjunctivae is pink. Thyroid not enlarged. No cervical lymphadenopathy. Lungs clear.  Heart regular rate and rhythm.  Abdomen is soft. Bowel sounds are positive. No hepatomegaly. No abdominal masses felt. No tenderness.  No edema to lower extremities.          Assessment & Plan:  Hepatitis C. Labs and US elastrography., PT/INR< Acute hepatitis panel, Hep C quain, Hep[ C genotype, Hepatic function, CBC, AFP, urine drug screen Tx pending on results of genotype  Hep C brochure given to patient.

## 2017-02-13 ENCOUNTER — Other Ambulatory Visit (INDEPENDENT_AMBULATORY_CARE_PROVIDER_SITE_OTHER): Payer: Self-pay | Admitting: Internal Medicine

## 2017-02-16 ENCOUNTER — Ambulatory Visit (HOSPITAL_COMMUNITY): Admission: RE | Admit: 2017-02-16 | Payer: Medicaid Other | Source: Ambulatory Visit

## 2017-02-16 LAB — CBC WITH DIFFERENTIAL/PLATELET
BASOS PCT: 0.6 %
Basophils Absolute: 57 cells/uL (ref 0–200)
Eosinophils Absolute: 295 cells/uL (ref 15–500)
Eosinophils Relative: 3.1 %
HCT: 40.5 % (ref 38.5–50.0)
Hemoglobin: 14.1 g/dL (ref 13.2–17.1)
Lymphs Abs: 2309 cells/uL (ref 850–3900)
MCH: 30.5 pg (ref 27.0–33.0)
MCHC: 34.8 g/dL (ref 32.0–36.0)
MCV: 87.7 fL (ref 80.0–100.0)
MONOS PCT: 8.8 %
MPV: 10.1 fL (ref 7.5–12.5)
Neutro Abs: 6004 cells/uL (ref 1500–7800)
Neutrophils Relative %: 63.2 %
PLATELETS: 290 10*3/uL (ref 140–400)
RBC: 4.62 10*6/uL (ref 4.20–5.80)
RDW: 13 % (ref 11.0–15.0)
TOTAL LYMPHOCYTE: 24.3 %
WBC: 9.5 10*3/uL (ref 3.8–10.8)
WBCMIX: 836 {cells}/uL (ref 200–950)

## 2017-02-16 LAB — HEPATIC FUNCTION PANEL
AG Ratio: 1.2 (calc) (ref 1.0–2.5)
ALT: 82 U/L — AB (ref 9–46)
AST: 56 U/L — ABNORMAL HIGH (ref 10–35)
Albumin: 4.1 g/dL (ref 3.6–5.1)
Alkaline phosphatase (APISO): 70 U/L (ref 40–115)
BILIRUBIN INDIRECT: 0.4 mg/dL (ref 0.2–1.2)
Bilirubin, Direct: 0.2 mg/dL (ref 0.0–0.2)
GLOBULIN: 3.3 g/dL (ref 1.9–3.7)
TOTAL PROTEIN: 7.4 g/dL (ref 6.1–8.1)
Total Bilirubin: 0.6 mg/dL (ref 0.2–1.2)

## 2017-02-16 LAB — HEPATITIS PANEL, ACUTE
HEP A IGM: NONREACTIVE
HEP B S AG: NONREACTIVE
Hep B C IgM: NONREACTIVE
Hepatitis C Ab: REACTIVE — AB
SIGNAL TO CUT-OFF: 34.7 — ABNORMAL HIGH (ref <1.00)

## 2017-02-16 LAB — HEPATITIS C GENOTYPE

## 2017-02-16 LAB — PROTIME-INR
INR: 1
Prothrombin Time: 10.6 s (ref 9.0–11.5)

## 2017-02-16 LAB — HEPATITIS C RNA QUANTITATIVE
HCV Quantitative Log: 5.12 Log IU/mL — ABNORMAL HIGH
HCV RNA, PCR, QN: 132000 [IU]/mL — AB

## 2017-02-16 LAB — AFP TUMOR MARKER: AFP TUMOR MARKER: 1.4 ng/mL (ref <6.1)

## 2017-02-22 LAB — DRUGS OF ABUSE SCREEN W/O ALC, ROUTINE URINE
AMPHETAMINES (1000 ng/mL SCRN): NEGATIVE
BARBITURATES: NEGATIVE
BENZODIAZEPINES: NEGATIVE
COCAINE METABOLITES: NEGATIVE
MARIJUANA MET (50 NG/ML SCRN): NEGATIVE
METHADONE: POSITIVE — AB
METHAQUALONE: NEGATIVE
OPIATES: NEGATIVE
PHENCYCLIDINE: NEGATIVE
PROPOXYPHENE: NEGATIVE

## 2017-02-25 ENCOUNTER — Ambulatory Visit (HOSPITAL_COMMUNITY)
Admission: RE | Admit: 2017-02-25 | Discharge: 2017-02-25 | Disposition: A | Discharge status: Home / Self Care | Payer: Medicaid Other | Source: Ambulatory Visit | Attending: Internal Medicine | Admitting: Internal Medicine

## 2017-02-25 DIAGNOSIS — K824 Cholesterolosis of gallbladder: Secondary | ICD-10-CM | POA: Insufficient documentation

## 2017-02-25 DIAGNOSIS — K769 Liver disease, unspecified: Secondary | ICD-10-CM | POA: Insufficient documentation

## 2017-02-25 DIAGNOSIS — B1920 Unspecified viral hepatitis C without hepatic coma: Secondary | ICD-10-CM | POA: Diagnosis not present

## 2017-02-26 ENCOUNTER — Telehealth (INDEPENDENT_AMBULATORY_CARE_PROVIDER_SITE_OTHER): Payer: Self-pay | Admitting: Internal Medicine

## 2017-03-02 NOTE — Telephone Encounter (Signed)
erro

## 2017-03-02 NOTE — Telephone Encounter (Signed)
err

## 2017-03-18 ENCOUNTER — Telehealth (INDEPENDENT_AMBULATORY_CARE_PROVIDER_SITE_OTHER): Payer: Self-pay | Admitting: *Deleted

## 2017-03-18 NOTE — Telephone Encounter (Signed)
The drug interaction is Atorvastation. ' I have spoken with his mother. Will stop the Atorvastatin

## 2017-03-18 NOTE — Telephone Encounter (Signed)
Per Delrae Renderri Setzer,NP - she has talked with the patient's Mother and they are stopping the Atorvastatin while the patient is on treatment with the Mayvret.  I called and talked with Joe at [facility] and told him what Terri ask me too. They are arranging for the medication to be sent to the office.

## 2017-03-18 NOTE — Telephone Encounter (Signed)
Wilburn Corneliaerri , Ericka from [facility] called and left a message on my voice mail late yesterday.  She states that there is a drug interaction with the Mavyret. She will need to talk with you prior to this medication being possibly. Released. Her call back number is [phone].

## 2017-03-27 ENCOUNTER — Telehealth (INDEPENDENT_AMBULATORY_CARE_PROVIDER_SITE_OTHER): Payer: Self-pay | Admitting: *Deleted

## 2017-03-27 NOTE — Telephone Encounter (Signed)
Patient was called and made aware that his Hep C medication was ready for pick up. He came and got it and we went over how he was to take it and also stressed that he should not take the Atorvastatin while being treated. Patient states that he has already stopped the medication. He will leave us a message with the date he plans to start the medication.  Patient will need to have the following labs done 4 weeks after starting the medication: CBC , Hepatic < Hep C RNA Quant. He will also need to have office visit 4 week post starting the medication.

## 2017-05-07 ENCOUNTER — Telehealth (INDEPENDENT_AMBULATORY_CARE_PROVIDER_SITE_OTHER): Payer: Self-pay | Admitting: Internal Medicine

## 2017-05-07 NOTE — Telephone Encounter (Signed)
Needs OV in 4 weeks. 

## 2017-05-08 ENCOUNTER — Encounter (INDEPENDENT_AMBULATORY_CARE_PROVIDER_SITE_OTHER): Payer: Self-pay | Admitting: Internal Medicine

## 2017-05-08 NOTE — Telephone Encounter (Signed)
Patient was given an appointment for 06/05/17 at 9:00am with Dorene Arerri Setzer, NP.  A letter was mailed to the patient.

## 2017-06-05 ENCOUNTER — Ambulatory Visit (INDEPENDENT_AMBULATORY_CARE_PROVIDER_SITE_OTHER): Payer: Medicaid Other | Admitting: Internal Medicine

## 2017-06-17 ENCOUNTER — Ambulatory Visit (INDEPENDENT_AMBULATORY_CARE_PROVIDER_SITE_OTHER): Payer: Medicaid Other | Admitting: Internal Medicine

## 2017-06-17 ENCOUNTER — Encounter (INDEPENDENT_AMBULATORY_CARE_PROVIDER_SITE_OTHER): Payer: Self-pay | Admitting: Internal Medicine

## 2017-06-17 VITALS — BP 140/90 | HR 64 | Temp 98.1°F | Ht 70.0 in | Wt 228.0 lb

## 2017-06-17 DIAGNOSIS — B1920 Unspecified viral hepatitis C without hepatic coma: Secondary | ICD-10-CM

## 2017-06-17 NOTE — Patient Instructions (Signed)
OV in 1 year.  

## 2017-06-17 NOTE — Progress Notes (Signed)
   Subjective:    Patient ID: Gene Smith, male    DOB: 08/02/1965, 52 y.o.   MRN: [id number]  HPI Here today for f/u. Hx of Hepatitis C Genotype 1A US Elast on 02/25/2017 fibrosis score F3-F4 Started tx with Mayvret x 8 weeks.  Started 03/28/2017. He has finished treatment.  Appetite is good. No weight loss.  Has a BM daily.  He has no GI complaints.    Review of Systems Past Medical History:  Diagnosis Date  . Anxiety   . Arthritis   . Back abrasion   . Depression   . GERD (gastroesophageal reflux disease)   . Mass     History reviewed. No pertinent surgical history.  Allergies  Allergen Reactions  . Pollen Extract Other (See Comments)    congestion    Current Outpatient Medications on File Prior to Visit  Medication Sig Dispense Refill  . albuterol (PROVENTIL HFA;VENTOLIN HFA) 108 (90 Base) MCG/ACT inhaler Inhale 2 puffs into the lungs every 6 (six) hours as needed for wheezing or shortness of breath. 1 Inhaler 2  . atorvastatin (LIPITOR) 20 MG tablet Take 1 tablet (20 mg total) by mouth daily. 30 tablet 4  . cholecalciferol (VITAMIN D) 1000 units tablet Take 5,000 Units by mouth daily.    Marland Kitchen. gabapentin (NEURONTIN) 300 MG capsule Take 300 mg by mouth 3 (three) times daily.     Marland Kitchen. ibuprofen (ADVIL,MOTRIN) 200 MG tablet Take 400 mg by mouth every 6 (six) hours as needed for moderate pain.    Marland Kitchen. METHADONE HCL PO Take 160 mg by mouth daily.     . mometasone-formoterol (DULERA) 100-5 MCG/ACT AERO Inhale 2 puffs into the lungs 2 (two) times daily. 1 Inhaler 4  . tamsulosin (FLOMAX) 0.4 MG CAPS capsule Take 0.4 mg by mouth.     No current facility-administered medications on file prior to visit.        Objective:   Physical Exam Blood pressure 140/90, pulse 64, temperature 98.1 F (36.7 C), height 5\' 10"  (1.778 m), weight 228 lb (103.4 kg). Alert and oriented. Skin warm and dry. Oral mucosa is moist.   . Sclera anicteric, conjunctivae is pink. Thyroid not enlarged. No  cervical lymphadenopathy. Lungs clear. Heart regular rate and rhythm.  Abdomen is soft. Bowel sounds are positive. No hepatomegaly. No abdominal masses felt. No tenderness.  No edema to lower extremities.          Assessment & Plan:  Hepatitis C. Will check labs today. Hepatic, CBC, Hep C quaint.  Recall for Hep C quaint in 6 months OV in 1 year.

## 2017-06-18 ENCOUNTER — Other Ambulatory Visit (INDEPENDENT_AMBULATORY_CARE_PROVIDER_SITE_OTHER): Payer: Self-pay | Admitting: *Deleted

## 2017-06-18 DIAGNOSIS — B182 Chronic viral hepatitis C: Secondary | ICD-10-CM

## 2017-06-22 ENCOUNTER — Telehealth (INDEPENDENT_AMBULATORY_CARE_PROVIDER_SITE_OTHER): Payer: Self-pay | Admitting: Internal Medicine

## 2017-06-22 LAB — CBC WITH DIFFERENTIAL/PLATELET
Basophils Absolute: 74 cells/uL (ref 0–200)
Basophils Relative: 0.8 %
EOS PCT: 1.6 %
Eosinophils Absolute: 149 cells/uL (ref 15–500)
HEMATOCRIT: 41.4 % (ref 38.5–50.0)
HEMOGLOBIN: 14 g/dL (ref 13.2–17.1)
LYMPHS ABS: 3943 {cells}/uL — AB (ref 850–3900)
MCH: 29.1 pg (ref 27.0–33.0)
MCHC: 33.8 g/dL (ref 32.0–36.0)
MCV: 86.1 fL (ref 80.0–100.0)
MONOS PCT: 6.2 %
MPV: 10.2 fL (ref 7.5–12.5)
NEUTROS ABS: 4557 {cells}/uL (ref 1500–7800)
NEUTROS PCT: 49 %
Platelets: 308 10*3/uL (ref 140–400)
RBC: 4.81 10*6/uL (ref 4.20–5.80)
RDW: 13.1 % (ref 11.0–15.0)
Total Lymphocyte: 42.4 %
WBC mixed population: 577 cells/uL (ref 200–950)
WBC: 9.3 10*3/uL (ref 3.8–10.8)

## 2017-06-22 LAB — HEPATIC FUNCTION PANEL
AG Ratio: 1.2 (calc) (ref 1.0–2.5)
ALBUMIN MSPROF: 3.9 g/dL (ref 3.6–5.1)
ALKALINE PHOSPHATASE (APISO): 55 U/L (ref 40–115)
ALT: 12 U/L (ref 9–46)
AST: 14 U/L (ref 10–35)
Bilirubin, Direct: 0.1 mg/dL (ref 0.0–0.2)
Globulin: 3.2 g/dL (calc) (ref 1.9–3.7)
Indirect Bilirubin: 0.2 mg/dL (calc) (ref 0.2–1.2)
Total Bilirubin: 0.3 mg/dL (ref 0.2–1.2)
Total Protein: 7.1 g/dL (ref 6.1–8.1)

## 2017-06-22 LAB — HEPATITIS C RNA QUANTITATIVE
HCV QUANT LOG: NOT DETECTED {Log_IU}/mL
HCV RNA, PCR, QN: NOT DETECTED [IU]/mL

## 2017-06-22 NOTE — Telephone Encounter (Signed)
OV in 1 year.  

## 2017-08-19 ENCOUNTER — Other Ambulatory Visit: Payer: Self-pay | Admitting: Neurology

## 2017-08-19 DIAGNOSIS — E782 Mixed hyperlipidemia: Secondary | ICD-10-CM

## 2017-08-19 DIAGNOSIS — I69351 Hemiplegia and hemiparesis following cerebral infarction affecting right dominant side: Secondary | ICD-10-CM

## 2017-08-21 ENCOUNTER — Other Ambulatory Visit: Payer: Self-pay | Admitting: Neurology

## 2017-08-21 DIAGNOSIS — E782 Mixed hyperlipidemia: Secondary | ICD-10-CM

## 2017-08-21 DIAGNOSIS — I69351 Hemiplegia and hemiparesis following cerebral infarction affecting right dominant side: Secondary | ICD-10-CM

## 2017-09-10 ENCOUNTER — Other Ambulatory Visit: Payer: Self-pay | Admitting: Neurology

## 2017-09-10 DIAGNOSIS — G8190 Hemiplegia, unspecified affecting unspecified side: Secondary | ICD-10-CM

## 2017-09-16 ENCOUNTER — Other Ambulatory Visit (HOSPITAL_COMMUNITY): Payer: Medicaid Other

## 2017-09-18 ENCOUNTER — Ambulatory Visit (HOSPITAL_COMMUNITY)
Admission: RE | Admit: 2017-09-18 | Discharge: 2017-09-18 | Disposition: A | Discharge status: Home / Self Care | Payer: Medicaid Other | Source: Ambulatory Visit | Attending: Neurology | Admitting: Neurology

## 2017-09-18 ENCOUNTER — Ambulatory Visit (HOSPITAL_COMMUNITY): Payer: Medicaid Other

## 2017-09-18 DIAGNOSIS — I69351 Hemiplegia and hemiparesis following cerebral infarction affecting right dominant side: Secondary | ICD-10-CM | POA: Diagnosis present

## 2017-09-18 DIAGNOSIS — E782 Mixed hyperlipidemia: Secondary | ICD-10-CM | POA: Insufficient documentation

## 2017-09-21 ENCOUNTER — Ambulatory Visit (HOSPITAL_COMMUNITY)
Admission: RE | Admit: 2017-09-21 | Discharge: 2017-09-21 | Disposition: A | Discharge status: Home / Self Care | Payer: Medicaid Other | Source: Ambulatory Visit | Attending: Neurology | Admitting: Neurology

## 2017-09-21 DIAGNOSIS — Z720 Tobacco use: Secondary | ICD-10-CM | POA: Insufficient documentation

## 2017-09-21 DIAGNOSIS — J449 Chronic obstructive pulmonary disease, unspecified: Secondary | ICD-10-CM | POA: Insufficient documentation

## 2017-09-21 DIAGNOSIS — G8190 Hemiplegia, unspecified affecting unspecified side: Secondary | ICD-10-CM | POA: Diagnosis not present

## 2017-11-13 ENCOUNTER — Ambulatory Visit (HOSPITAL_COMMUNITY)
Admission: RE | Admit: 2017-11-13 | Discharge: 2017-11-13 | Disposition: A | Discharge status: Home / Self Care | Payer: Medicaid Other | Source: Ambulatory Visit | Attending: Family Medicine | Admitting: Family Medicine

## 2017-11-13 ENCOUNTER — Other Ambulatory Visit (HOSPITAL_COMMUNITY): Payer: Self-pay | Admitting: Family Medicine

## 2017-11-13 DIAGNOSIS — G8929 Other chronic pain: Secondary | ICD-10-CM | POA: Diagnosis present

## 2017-11-13 DIAGNOSIS — M545 Low back pain: Secondary | ICD-10-CM | POA: Diagnosis present

## 2017-11-13 DIAGNOSIS — M5136 Other intervertebral disc degeneration, lumbar region: Secondary | ICD-10-CM | POA: Insufficient documentation

## 2017-11-16 ENCOUNTER — Other Ambulatory Visit (INDEPENDENT_AMBULATORY_CARE_PROVIDER_SITE_OTHER): Payer: Self-pay | Admitting: *Deleted

## 2017-11-16 ENCOUNTER — Encounter (INDEPENDENT_AMBULATORY_CARE_PROVIDER_SITE_OTHER): Payer: Self-pay | Admitting: *Deleted

## 2017-11-16 DIAGNOSIS — B182 Chronic viral hepatitis C: Secondary | ICD-10-CM

## 2018-02-03 ENCOUNTER — Other Ambulatory Visit (HOSPITAL_COMMUNITY): Payer: Self-pay | Admitting: Family Medicine

## 2018-02-03 DIAGNOSIS — M545 Low back pain, unspecified: Secondary | ICD-10-CM

## 2018-02-08 ENCOUNTER — Ambulatory Visit (HOSPITAL_COMMUNITY)
Admission: RE | Admit: 2018-02-08 | Discharge: 2018-02-08 | Disposition: A | Discharge status: Home / Self Care | Payer: Medicaid Other | Source: Ambulatory Visit | Attending: Family Medicine | Admitting: Family Medicine

## 2018-02-08 DIAGNOSIS — M545 Low back pain, unspecified: Secondary | ICD-10-CM

## 2018-02-08 DIAGNOSIS — M4807 Spinal stenosis, lumbosacral region: Secondary | ICD-10-CM | POA: Insufficient documentation

## 2018-02-08 DIAGNOSIS — M1288 Other specific arthropathies, not elsewhere classified, other specified site: Secondary | ICD-10-CM | POA: Insufficient documentation

## 2018-06-17 ENCOUNTER — Encounter (INDEPENDENT_AMBULATORY_CARE_PROVIDER_SITE_OTHER): Payer: Self-pay | Admitting: Internal Medicine

## 2018-06-17 ENCOUNTER — Ambulatory Visit (INDEPENDENT_AMBULATORY_CARE_PROVIDER_SITE_OTHER): Payer: Medicaid Other | Admitting: Internal Medicine

## 2018-06-17 VITALS — BP 140/80 | HR 71 | Temp 98.1°F | Ht 70.0 in | Wt 227.3 lb

## 2018-06-17 DIAGNOSIS — B182 Chronic viral hepatitis C: Secondary | ICD-10-CM | POA: Diagnosis not present

## 2018-06-17 NOTE — Patient Instructions (Addendum)
US RUQ OV in 1 year.  

## 2018-06-17 NOTE — Progress Notes (Signed)
   Subjective:    Patient ID: TEVONTE DERDA, male    DOB: April 17, 1966, 53 y.o.   MRN: [id number]  HPI Here today for f/u. Last seen  In February of 2019. Hx of Hepatitis C successfully treated with Mavyret x 8 weeks. He states he is doing well. Appetite is good. No weight loss. Has a BM daily. Has no GI complaints.  Genotype 1a. [place] Elast 04/27/2016 F3-F4.     Review of Systems Past Medical History:  Diagnosis Date  . Anxiety   . Arthritis   . Back abrasion   . Depression   . GERD (gastroesophageal reflux disease)   . Mass     History reviewed. No pertinent surgical history.  Allergies  Allergen Reactions  . Pollen Extract Other (See Comments)    congestion    Current Outpatient Medications on File Prior to Visit  Medication Sig Dispense Refill  . albuterol (PROVENTIL HFA;VENTOLIN HFA) 108 (90 Base) MCG/ACT inhaler Inhale 2 puffs into the lungs every 6 (six) hours as needed for wheezing or shortness of breath. 1 Inhaler 2  . aspirin 81 MG chewable tablet Chew by mouth daily.    Marland Kitchen atorvastatin (LIPITOR) 20 MG tablet Take 1 tablet (20 mg total) by mouth daily. 30 tablet 4  . cholecalciferol (VITAMIN D) 1000 units tablet Take 5,000 Units by mouth daily.    Marland Kitchen gabapentin (NEURONTIN) 300 MG capsule Take 300 mg by mouth 3 (three) times daily.     Marland Kitchen ibuprofen (ADVIL,MOTRIN) 200 MG tablet Take 400 mg by mouth every 6 (six) hours as needed for moderate pain.    Marland Kitchen METHADONE HCL PO Take 190 mg by mouth daily.     . mometasone-formoterol (DULERA) 100-5 MCG/ACT AERO Inhale 2 puffs into the lungs 2 (two) times daily. 1 Inhaler 4  . tamsulosin (FLOMAX) 0.4 MG CAPS capsule Take 0.4 mg by mouth.    . traZODone (DESYREL) 50 MG tablet Take 50 mg by mouth at bedtime.     No current facility-administered medications on file prior to visit.         Objective:   Physical Exam Blood pressure 140/80, pulse 71, temperature 98.1 F (36.7 C), height 5\' 10"  (1.778 m), weight 227 lb 4.8 oz (103.1  kg). Alert and oriented. Skin warm and dry. Oral mucosa is moist.   . Sclera anicteric, conjunctivae is pink. Thyroid not enlarged. No cervical lymphadenopathy. Lungs clear. Heart regular rate and rhythm.  Abdomen is soft. Bowel sounds are positive. No hepatomegaly. No abdominal masses felt. No tenderness.  No edema to lower extremities.          Assessment & Plan:  Hepatitis C successfully treated. Will get an [place] RUQ. OV in 1 year.

## 2018-06-25 ENCOUNTER — Ambulatory Visit (HOSPITAL_COMMUNITY)
Admission: RE | Admit: 2018-06-25 | Discharge: 2018-06-25 | Disposition: A | Discharge status: Home / Self Care | Payer: Medicaid Other | Source: Ambulatory Visit | Attending: Internal Medicine | Admitting: Internal Medicine

## 2018-06-25 DIAGNOSIS — B182 Chronic viral hepatitis C: Secondary | ICD-10-CM | POA: Diagnosis present

## 2020-03-01 IMAGING — MR MR HEAD W/O CM
9 of 10 series · 41 of 48 positions shown · non-contrast
Comparison: None.

CLINICAL DATA: Hemi paresis on the right.

EXAM:
MRI HEAD WITHOUT CONTRAST
TECHNIQUE: Multiplanar, multiecho pulse sequences of the brain and surrounding
structures were obtained without intravenous contrast.

[Series 2: T1 · sagittal · 5.0mm · 0.39mm/px · 2 of 21 slices shown (1 of 2)]
[im 1/21]
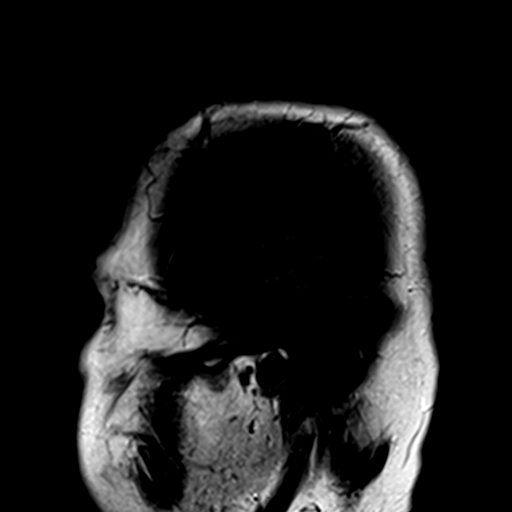
[im 21/21]
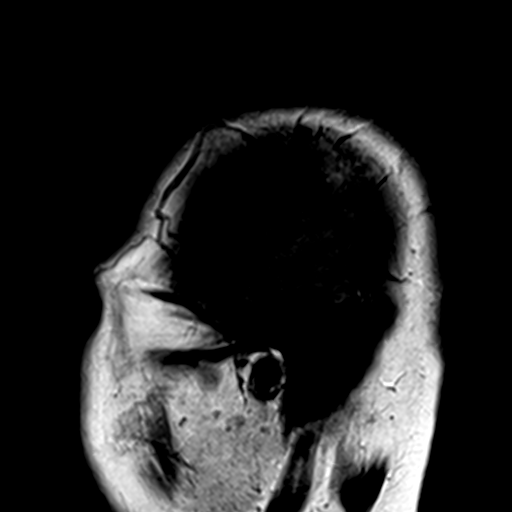

[Series 3: DWI · axial · 3.0mm · 0.72mm/px · z∈[-111,+47]mm · 6 of 54 slices shown (1 of 4)]
[im 1/54]
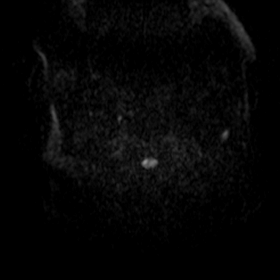
[im 11/54]
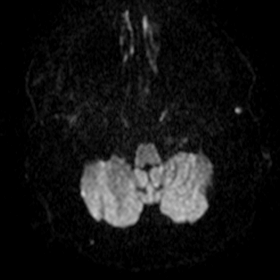
[im 22/54]
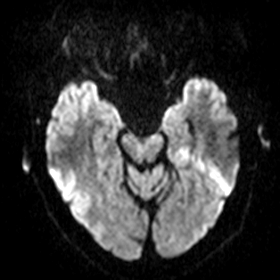
[im 32/54]
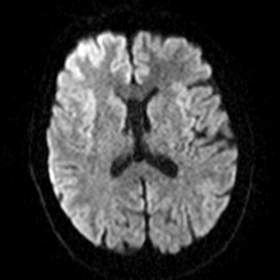
[im 43/54]
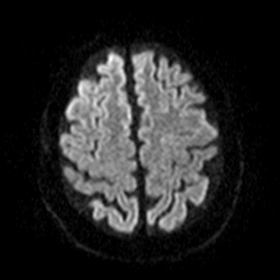
[im 54/54]
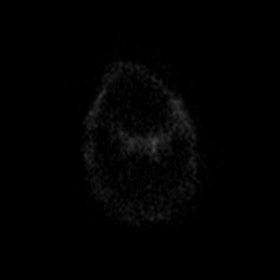

[Series 4: DWI · axial · 3.0mm · 0.82mm/px · z∈[-116,+45]mm · 7 of 55 slices shown (2 of 4)]
[im 1/55]
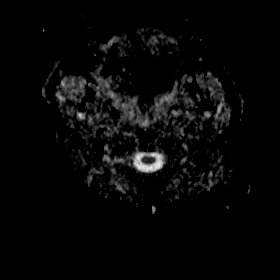
[im 10/55]
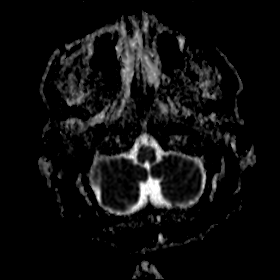
[im 19/55]
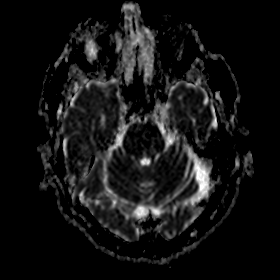
[im 28/55]
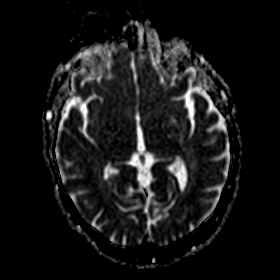
[im 37/55]
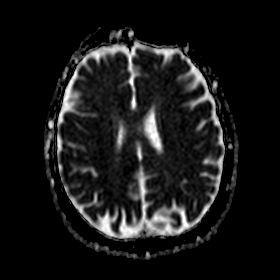
[im 46/55]
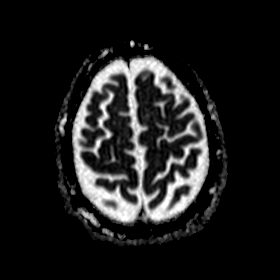
[im 55/55]
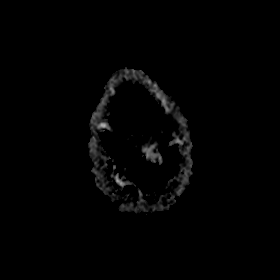

[Series 5: DWI · coronal · 5.0mm · 0.45mm/px · 4 of 35 slices shown (3 of 4)]
[im 1/35]
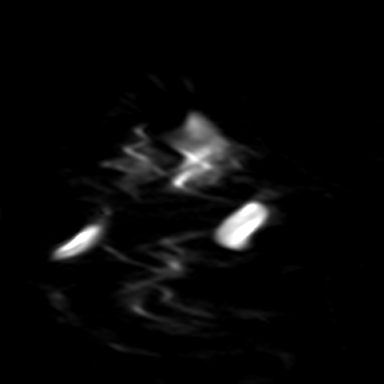
[im 12/35]
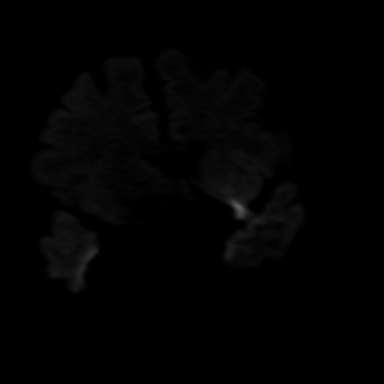
[im 23/35]
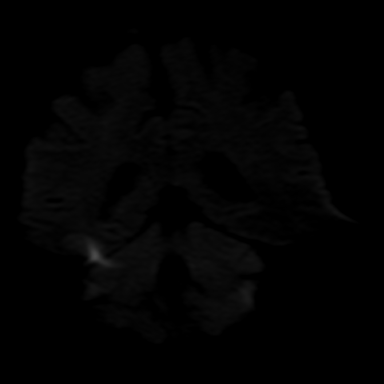
[im 35/35]
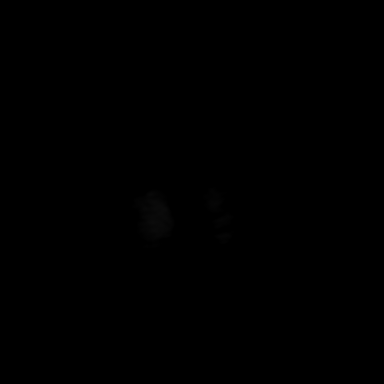

[Series 6: DWI · coronal · 5.0mm · 0.53mm/px · 4 of 34 slices shown (4 of 4)]
[im 1/34]
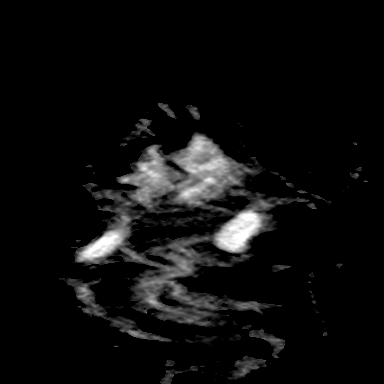
[im 12/34]
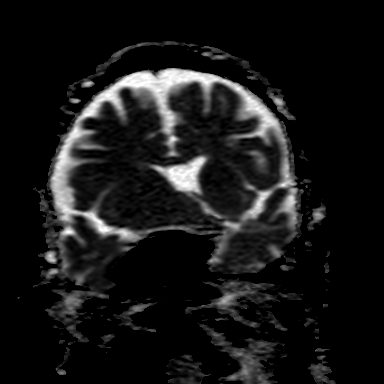
[im 23/34]
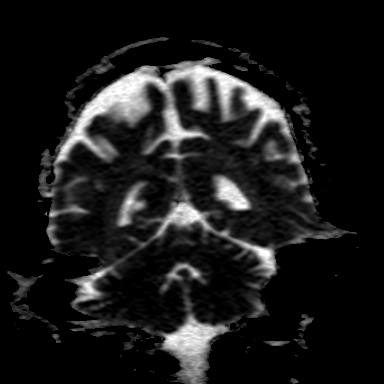
[im 34/34]
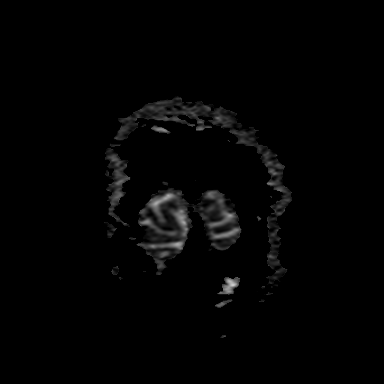

[Series 7: T2 · axial · 5.0mm · 0.64mm/px · z∈[-105,+37]mm · 3 of 23 slices shown (1 of 2)]
[im 1/23]
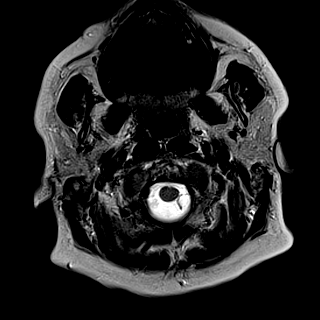
[im 12/23]
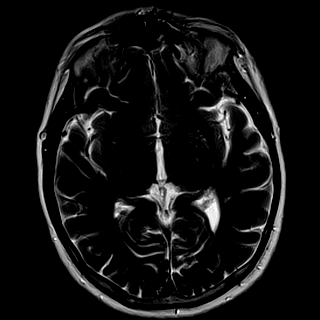
[im 23/23]
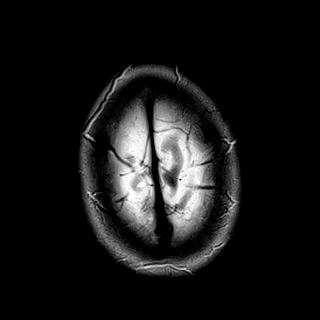

[Series 8: FLAIR · axial · 3.0mm · 0.81mm/px · z∈[-102,+35]mm · 6 of 47 slices shown]
[im 1/47]
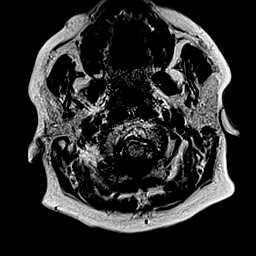
[im 10/47]
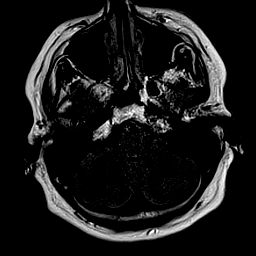
[im 19/47]
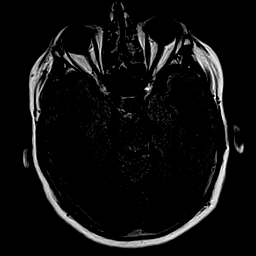
[im 28/47]
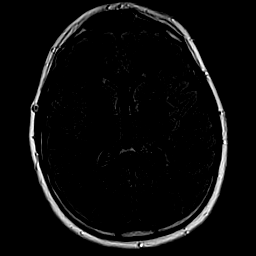
[im 37/47]
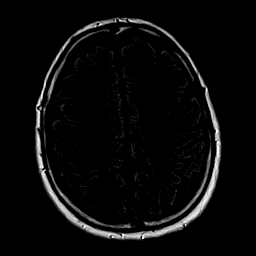
[im 47/47]
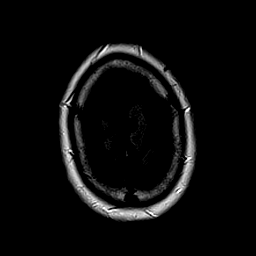

[Series 9: T1 · axial · 2.0mm · 0.44mm/px · z∈[-112,-3]mm · 6 of 84 slices shown (2 of 2)]
[im 1/84]
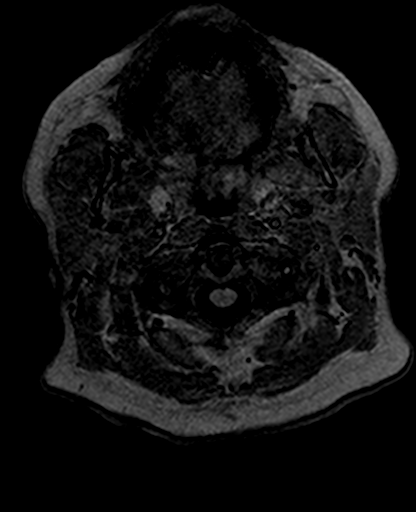
[im 10/84]
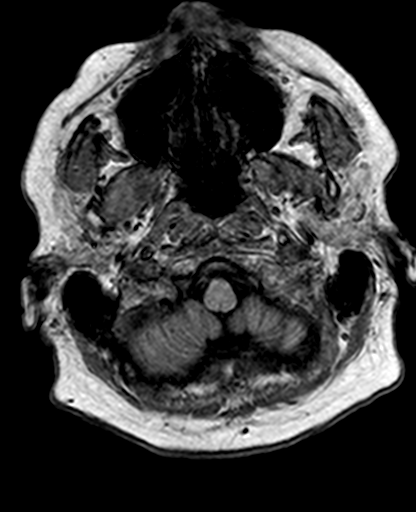
[im 28/84]
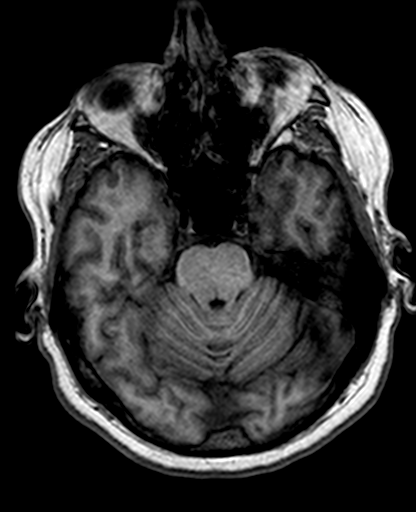
[im 37/84]
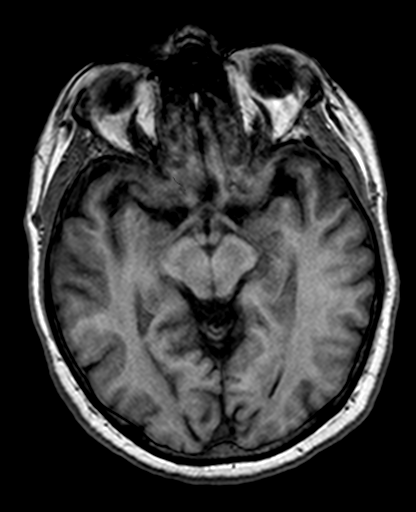
[im 47/84]
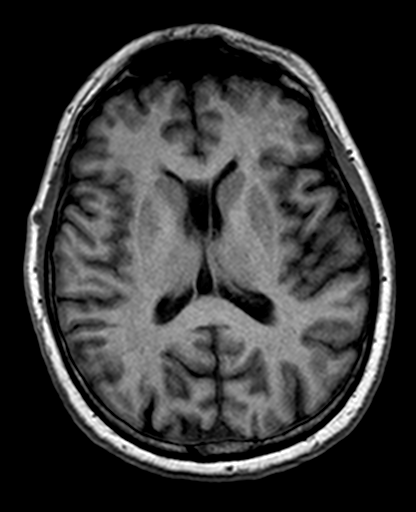
[im 56/84]
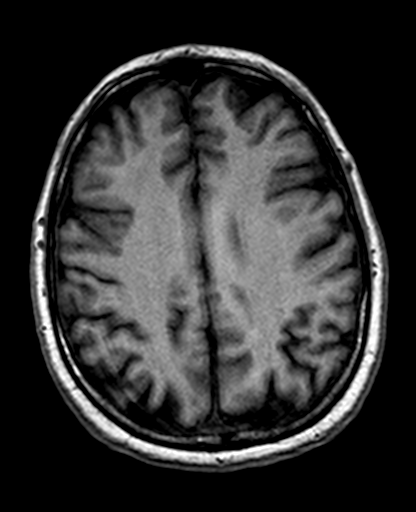

[Series 11: T2 · coronal · 5.0mm · 0.63mm/px · 3 of 28 slices shown (2 of 2)]
[im 1/28]
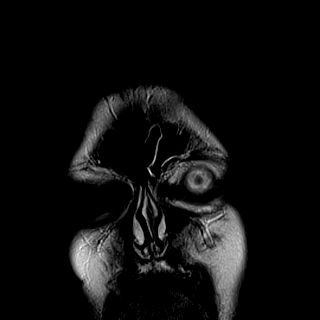
[im 14/28]
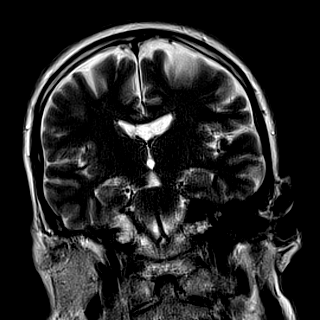
[im 28/28]
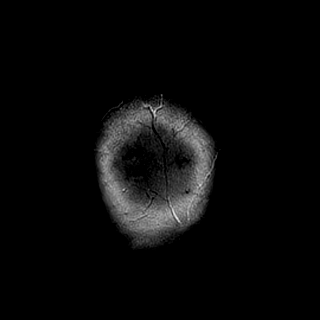

[41 of 48 positions shown; findings below may reference images not displayed]

FINDINGS: Brain: No infarction, hemorrhage, hydrocephalus, extra-axial
collection or mass lesion. Normal brain volume and white matter
appearance. Incidental cavum septum pellucidum.

Vascular: Major flow voids are preserved

Skull and upper cervical spine: No evidence of marrow lesion

Sinuses/Orbits: Patchy mucosal thickening in the frontal and ethmoid
sinuses. Negative orbits.
IMPRESSION: Negative brain MRI.  No explanation for symptoms.

## 2020-04-18 ENCOUNTER — Encounter (INDEPENDENT_AMBULATORY_CARE_PROVIDER_SITE_OTHER): Payer: Self-pay | Admitting: *Deleted

## 2021-03-25 ENCOUNTER — Encounter (INDEPENDENT_AMBULATORY_CARE_PROVIDER_SITE_OTHER): Payer: Self-pay | Admitting: *Deleted

## 2021-07-09 ENCOUNTER — Ambulatory Visit (HOSPITAL_COMMUNITY)
Admission: RE | Admit: 2021-07-09 | Discharge: 2021-07-09 | Disposition: A | Discharge status: Home / Self Care | Payer: Medicaid Other | Source: Ambulatory Visit | Attending: Adult Health | Admitting: Adult Health

## 2021-07-09 ENCOUNTER — Other Ambulatory Visit (HOSPITAL_COMMUNITY): Payer: Self-pay | Admitting: Adult Health

## 2021-07-09 ENCOUNTER — Other Ambulatory Visit: Payer: Self-pay

## 2021-07-09 DIAGNOSIS — M79644 Pain in right finger(s): Secondary | ICD-10-CM | POA: Diagnosis present

## 2021-09-20 ENCOUNTER — Encounter (INDEPENDENT_AMBULATORY_CARE_PROVIDER_SITE_OTHER): Payer: Self-pay | Admitting: *Deleted

## 2022-05-21 ENCOUNTER — Encounter: Payer: Self-pay | Admitting: *Deleted

## 2022-10-20 ENCOUNTER — Encounter: Payer: Self-pay | Admitting: *Deleted

## 2023-06-30 ENCOUNTER — Other Ambulatory Visit (HOSPITAL_COMMUNITY): Payer: Self-pay | Admitting: Adult Health

## 2023-06-30 DIAGNOSIS — R0781 Pleurodynia: Secondary | ICD-10-CM

## 2024-01-05 ENCOUNTER — Emergency Department (HOSPITAL_COMMUNITY): Payer: MEDICAID

## 2024-01-05 ENCOUNTER — Encounter (HOSPITAL_COMMUNITY): Payer: Self-pay | Admitting: Emergency Medicine

## 2024-01-05 ENCOUNTER — Inpatient Hospital Stay (HOSPITAL_COMMUNITY)
Admission: EM | Admit: 2024-01-05 | Discharge: 2024-01-09 | DRG: 683 | Discharge status: Against Medical Advice | Payer: MEDICAID | Attending: Family Medicine | Admitting: Family Medicine

## 2024-01-05 ENCOUNTER — Other Ambulatory Visit: Payer: Self-pay

## 2024-01-05 DIAGNOSIS — Z818 Family history of other mental and behavioral disorders: Secondary | ICD-10-CM

## 2024-01-05 DIAGNOSIS — E860 Dehydration: Secondary | ICD-10-CM | POA: Diagnosis present

## 2024-01-05 DIAGNOSIS — E876 Hypokalemia: Secondary | ICD-10-CM | POA: Diagnosis not present

## 2024-01-05 DIAGNOSIS — Z7951 Long term (current) use of inhaled steroids: Secondary | ICD-10-CM | POA: Diagnosis not present

## 2024-01-05 DIAGNOSIS — E872 Acidosis, unspecified: Secondary | ICD-10-CM | POA: Diagnosis present

## 2024-01-05 DIAGNOSIS — Z8249 Family history of ischemic heart disease and other diseases of the circulatory system: Secondary | ICD-10-CM | POA: Diagnosis not present

## 2024-01-05 DIAGNOSIS — D649 Anemia, unspecified: Secondary | ICD-10-CM | POA: Diagnosis present

## 2024-01-05 DIAGNOSIS — E871 Hypo-osmolality and hyponatremia: Secondary | ICD-10-CM | POA: Diagnosis not present

## 2024-01-05 DIAGNOSIS — W06XXXA Fall from bed, initial encounter: Secondary | ICD-10-CM | POA: Diagnosis present

## 2024-01-05 DIAGNOSIS — T796XXA Traumatic ischemia of muscle, initial encounter: Secondary | ICD-10-CM

## 2024-01-05 DIAGNOSIS — J449 Chronic obstructive pulmonary disease, unspecified: Secondary | ICD-10-CM | POA: Diagnosis present

## 2024-01-05 DIAGNOSIS — S7002XA Contusion of left hip, initial encounter: Secondary | ICD-10-CM | POA: Diagnosis present

## 2024-01-05 DIAGNOSIS — T405X1A Poisoning by cocaine, accidental (unintentional), initial encounter: Secondary | ICD-10-CM | POA: Diagnosis present

## 2024-01-05 DIAGNOSIS — Z7982 Long term (current) use of aspirin: Secondary | ICD-10-CM | POA: Diagnosis not present

## 2024-01-05 DIAGNOSIS — F0390 Unspecified dementia without behavioral disturbance: Secondary | ICD-10-CM | POA: Diagnosis present

## 2024-01-05 DIAGNOSIS — K219 Gastro-esophageal reflux disease without esophagitis: Secondary | ICD-10-CM | POA: Diagnosis present

## 2024-01-05 DIAGNOSIS — S7012XA Contusion of left thigh, initial encounter: Secondary | ICD-10-CM | POA: Diagnosis present

## 2024-01-05 DIAGNOSIS — Z79899 Other long term (current) drug therapy: Secondary | ICD-10-CM | POA: Diagnosis not present

## 2024-01-05 DIAGNOSIS — Z833 Family history of diabetes mellitus: Secondary | ICD-10-CM

## 2024-01-05 DIAGNOSIS — M21372 Foot drop, left foot: Secondary | ICD-10-CM | POA: Diagnosis present

## 2024-01-05 DIAGNOSIS — Z5329 Procedure and treatment not carried out because of patient's decision for other reasons: Secondary | ICD-10-CM | POA: Diagnosis present

## 2024-01-05 DIAGNOSIS — F17218 Nicotine dependence, cigarettes, with other nicotine-induced disorders: Secondary | ICD-10-CM | POA: Diagnosis present

## 2024-01-05 DIAGNOSIS — Y92019 Unspecified place in single-family (private) house as the place of occurrence of the external cause: Secondary | ICD-10-CM | POA: Diagnosis not present

## 2024-01-05 DIAGNOSIS — Z803 Family history of malignant neoplasm of breast: Secondary | ICD-10-CM

## 2024-01-05 DIAGNOSIS — N179 Acute kidney failure, unspecified: Secondary | ICD-10-CM | POA: Diagnosis present

## 2024-01-05 DIAGNOSIS — M6282 Rhabdomyolysis: Secondary | ICD-10-CM | POA: Diagnosis present

## 2024-01-05 DIAGNOSIS — M1990 Unspecified osteoarthritis, unspecified site: Secondary | ICD-10-CM | POA: Diagnosis present

## 2024-01-05 DIAGNOSIS — M79672 Pain in left foot: Secondary | ICD-10-CM | POA: Diagnosis not present

## 2024-01-05 DIAGNOSIS — F1121 Opioid dependence, in remission: Secondary | ICD-10-CM | POA: Diagnosis present

## 2024-01-05 LAB — BASIC METABOLIC PANEL WITH GFR
Anion gap: 19 — ABNORMAL HIGH (ref 5–15)
Anion gap: 16 — ABNORMAL HIGH (ref 5–15)
BUN: 88 mg/dL — ABNORMAL HIGH (ref 6–20)
BUN: 86 mg/dL — ABNORMAL HIGH (ref 6–20)
CO2: 17 mmol/L — ABNORMAL LOW (ref 22–32)
CO2: 18 mmol/L — ABNORMAL LOW (ref 22–32)
Calcium: 7.4 mg/dL — ABNORMAL LOW (ref 8.9–10.3)
Calcium: 6.9 mg/dL — ABNORMAL LOW (ref 8.9–10.3)
Chloride: 90 mmol/L — ABNORMAL LOW (ref 98–111)
Chloride: 93 mmol/L — ABNORMAL LOW (ref 98–111)
Creatinine, Ser: 7.67 mg/dL — ABNORMAL HIGH (ref 0.61–1.24)
Creatinine, Ser: 7.5 mg/dL — ABNORMAL HIGH (ref 0.61–1.24)
GFR, Estimated: 7 mL/min — ABNORMAL LOW (ref >60)
GFR, Estimated: 8 mL/min — ABNORMAL LOW (ref >60)
Glucose, Bld: 108 mg/dL — ABNORMAL HIGH (ref 70–99)
Glucose, Bld: 163 mg/dL — ABNORMAL HIGH (ref 70–99)
Potassium: 3.9 mmol/L (ref 3.5–5.1)
Potassium: 3.4 mmol/L — ABNORMAL LOW (ref 3.5–5.1)
Sodium: 126 mmol/L — ABNORMAL LOW (ref 135–145)
Sodium: 127 mmol/L — ABNORMAL LOW (ref 135–145)

## 2024-01-05 LAB — CBC WITH DIFFERENTIAL/PLATELET
Abs Immature Granulocytes: 0.09 K/uL — ABNORMAL HIGH (ref 0.00–0.07)
Basophils Absolute: 0.1 K/uL (ref 0.0–0.1)
Basophils Relative: 1 %
Eosinophils Absolute: 0.1 K/uL (ref 0.0–0.5)
Eosinophils Relative: 1 %
HCT: 35.6 % — ABNORMAL LOW (ref 39.0–52.0)
Hemoglobin: 12.6 g/dL — ABNORMAL LOW (ref 13.0–17.0)
Immature Granulocytes: 1 %
Lymphocytes Relative: 9 %
Lymphs Abs: 0.9 K/uL (ref 0.7–4.0)
MCH: 29.5 pg (ref 26.0–34.0)
MCHC: 35.4 g/dL (ref 30.0–36.0)
MCV: 83.4 fL (ref 80.0–100.0)
Monocytes Absolute: 0.8 K/uL (ref 0.1–1.0)
Monocytes Relative: 8 %
Neutro Abs: 7.4 K/uL (ref 1.7–7.7)
Neutrophils Relative %: 80 %
Platelets: 238 K/uL (ref 150–400)
RBC: 4.27 MIL/uL (ref 4.22–5.81)
RDW: 12.9 % (ref 11.5–15.5)
WBC: 9.3 K/uL (ref 4.0–10.5)
nRBC: 0 % (ref 0.0–0.2)

## 2024-01-05 LAB — URINALYSIS, ROUTINE W REFLEX MICROSCOPIC
Bacteria, UA: NONE SEEN
Bilirubin Urine: NEGATIVE
Glucose, UA: 50 mg/dL — AB
Ketones, ur: NEGATIVE mg/dL
Leukocytes,Ua: NEGATIVE
Nitrite: NEGATIVE
Protein, ur: 30 mg/dL — AB
Specific Gravity, Urine: 1.01 (ref 1.005–1.030)
pH: 5 (ref 5.0–8.0)

## 2024-01-05 LAB — CK: Total CK: 27024 U/L — ABNORMAL HIGH (ref 49–397)

## 2024-01-05 MED ORDER — SODIUM CHLORIDE 0.9 % IV BOLUS
500.0000 mL | Freq: Once | INTRAVENOUS | Status: AC
  Administered 2024-01-05: 500 mL via INTRAVENOUS

## 2024-01-05 MED ORDER — ONDANSETRON HCL 4 MG PO TABS: 4.0000 mg | ORAL_TABLET | Freq: Four times a day (QID) | ORAL | Status: DC | PRN

## 2024-01-05 MED ORDER — HEPARIN SODIUM (PORCINE) 5000 UNIT/ML IJ SOLN
5000.0000 [IU] | Freq: Three times a day (TID) | INTRAMUSCULAR | Status: DC
  Filled 2024-01-05 (×4): qty 1

## 2024-01-05 MED ORDER — OXYCODONE-ACETAMINOPHEN 5-325 MG PO TABS
1.0000 | ORAL_TABLET | Freq: Three times a day (TID) | ORAL | Status: DC | PRN
  Administered 2024-01-06 (×2): 1 via ORAL
  Filled 2024-01-05 (×2): qty 1

## 2024-01-05 MED ORDER — CHLORHEXIDINE GLUCONATE CLOTH 2 % EX PADS
6.0000 | MEDICATED_PAD | Freq: Every day | CUTANEOUS | Status: DC
  Administered 2024-01-06: 6 via TOPICAL

## 2024-01-05 MED ORDER — OXYCODONE-ACETAMINOPHEN 5-325 MG PO TABS
1.0000 | ORAL_TABLET | Freq: Once | ORAL | Status: AC
  Administered 2024-01-05: 1 via ORAL
  Filled 2024-01-05: qty 1

## 2024-01-05 MED ORDER — DEXTROSE 5 % IV SOLN
INTRAVENOUS | Status: DC
  Filled 2024-01-05 (×9): qty 1000

## 2024-01-05 MED ORDER — SODIUM CHLORIDE 0.9 % IV BOLUS
1000.0000 mL | Freq: Once | INTRAVENOUS | Status: AC
  Administered 2024-01-05: 1000 mL via INTRAVENOUS

## 2024-01-05 MED ORDER — SODIUM CHLORIDE 0.9 % IV SOLN: INTRAVENOUS | Status: DC

## 2024-01-05 MED ORDER — ACETAMINOPHEN 325 MG PO TABS
650.0000 mg | ORAL_TABLET | Freq: Four times a day (QID) | ORAL | Status: DC | PRN
  Administered 2024-01-06 (×3): 650 mg via ORAL
  Filled 2024-01-05 (×5): qty 2

## 2024-01-05 MED ORDER — ONDANSETRON HCL 4 MG/2ML IJ SOLN
4.0000 mg | Freq: Four times a day (QID) | INTRAMUSCULAR | Status: DC | PRN
  Administered 2024-01-07: 4 mg via INTRAVENOUS
  Filled 2024-01-05: qty 2

## 2024-01-05 MED ORDER — ACETAMINOPHEN 650 MG RE SUPP: 650.0000 mg | Freq: Four times a day (QID) | RECTAL | Status: DC | PRN

## 2024-01-05 MED ORDER — POLYETHYLENE GLYCOL 3350 17 G PO PACK: 17.0000 g | PACK | Freq: Every day | ORAL | Status: DC | PRN

## 2024-01-05 NOTE — ED Triage Notes (Signed)
 Pt bib [facility] for a fall out of bed last Thursday. Pt c/o left hip pain and left foot numbness.

## 2024-01-05 NOTE — ED Notes (Signed)
 Pts Sister and Mother walked back into the Pts treatment room and began an argument in the hallway of the ED. Security called and notified. Pt became agitated and demanded he not have them as visitors.

## 2024-01-05 NOTE — Assessment & Plan Note (Addendum)
 Severe rhabdomyolysis with CK of 27,024. - Hydrate per above with sodium bicarb and normal saline

## 2024-01-05 NOTE — H&P (Signed)
 History and Physical    Gene Smith FMW:[id number] DOB: [birth year] DOA: 01/05/2024  PCP: Nsumanganyi, [place] Cesar, NP   Patient coming from: Home  I have personally briefly reviewed patient's old medical records in [facility]  Chief Complaint: Fall  HPI: Gene Smith is a 58 y.o. male with medical history significant for tobacco abuse, COPD, depression.  Patient was brought to the ED via EMS with complains of left hip pain.  Patient reports 5 days ago on Tuesday 9/11, he fell out of bed.  This is not unusual he tells me because he sleeps at the edge of the bed, and his bed is quite high of the floor, and he has rolled out of bed like this before. He fell onto his left hip and has had subsequent severe pain to the left hip radiating down to his leg, pain was so severe he he did not ambulate, and has been lying in bed, he reports vomiting about 6-7 times over the past few days due to severe pain, diarrhea.  No abdominal pain.  No fevers no chills.  He also barely drank water so he would not have to get up to use the bathroom.  Reports poor oral intake over the past few days as he lives with his mother. Reports he last had blood work about 6 months ago by his outpatient provider and it was normal.  He is not on any medications.   ED Course: Temperature 98.3.  Heart rate 71-84.  Respirate rate 16-17.  Blood pressure systolic 120s to 873.  O2 sats greater 92% on room air. Cr - Markedly elevated at 7.67.  BUN 88, anion gap 19, serum bicarb of 17.  Sodium 126. CK markedly elevated at 27,024. 2 L bolus given.  Sodium bicarb drip started at 75 cc/h. CT left hip-  No evidence of acute left hip fracture or dislocation. Mild subcutaneous edema anterolaterally in the proximal thigh. No organized fluid collection, foreign body or soft tissue emphysema demonstrated. Left LE venous Doppler negative for DVT. CT lumbar spine without acute abnormality. Hospitalist to admit for AKI.  Review of Systems:  As per HPI all other systems reviewed and negative.  Past Medical History:  Diagnosis Date   Anxiety    Arthritis    Back abrasion    Depression    GERD (gastroesophageal reflux disease)    Mass     No past surgical history on file.   reports that he has been smoking cigarettes. He has a 38 pack-year smoking history. He has quit using smokeless tobacco. He reports current drug use. Drug: Cocaine. He reports that he does not drink alcohol.  Allergies  Allergen Reactions   Pollen Extract Other (See Comments)    congestion    Family History  Problem Relation Age of Onset   Heart disease Father    Mental illness Father    Cancer Mother        breast   Mental illness Sister    Cancer Maternal Uncle    Diabetes Maternal Grandmother    Diabetes Maternal Grandfather    Diabetes Paternal Grandmother    Diabetes Paternal Grandfather     Prior to Admission medications   Medication Sig Start Date End Date Taking? Authorizing Provider  albuterol  (PROVENTIL  HFA;VENTOLIN  HFA) 108 (90 Base) MCG/ACT inhaler Inhale 2 puffs into the lungs every 6 (six) hours as needed for wheezing or shortness of breath. 08/18/16   Comer Kirsch, PA-C  aspirin 81  MG chewable tablet Chew by mouth daily.    [provider]  atorvastatin  (LIPITOR) 20 MG tablet Take 1 tablet (20 mg total) by mouth daily. 08/12/16   Comer Kirsch, PA-C  cholecalciferol  (VITAMIN D ) 1000 units tablet Take 5,000 Units by mouth daily.    [provider]  gabapentin  (NEURONTIN ) 300 MG capsule Take 300 mg by mouth 3 (three) times daily.     [provider]  ibuprofen (ADVIL,MOTRIN) 200 MG tablet Take 400 mg by mouth every 6 (six) hours as needed for moderate pain.    [provider]  METHADONE HCL PO Take 190 mg by mouth daily.     [provider]  mometasone -formoterol  (DULERA) 100-5 MCG/ACT AERO Inhale 2 puffs into the lungs 2 (two) times daily. 08/12/16   Comer Kirsch, PA-C   tamsulosin (FLOMAX) 0.4 MG CAPS capsule Take 0.4 mg by mouth.    [provider]  traZODone (DESYREL) 50 MG tablet Take 50 mg by mouth at bedtime.    [provider]    Physical Exam: Vitals:   01/05/24 1023 01/05/24 1025 01/05/24 1143 01/05/24 1509  BP: 120/75   126/81  Pulse: 84   71  Resp: 16   17  Temp: 98.3 F (36.8 C)   98.2 F (36.8 C)  TempSrc: Oral   Oral  SpO2: 92%  93% 95%  Weight:  86.2 kg    Height:  5' 10 (1.778 m)      Constitutional: NAD, calm, comfortable Vitals:   01/05/24 1023 01/05/24 1025 01/05/24 1143 01/05/24 1509  BP: 120/75   126/81  Pulse: 84   71  Resp: 16   17  Temp: 98.3 F (36.8 C)   98.2 F (36.8 C)  TempSrc: Oral   Oral  SpO2: 92%  93% 95%  Weight:  86.2 kg    Height:  5' 10 (1.778 m)     Eyes: PERRL, lids and conjunctivae normal ENMT: Mucous membranes are moist..  Neck: normal, supple, no masses, no thyromegaly Respiratory: clear to auscultation bilaterally, no wheezing, no crackles. Normal respiratory effort. No accessory muscle use.  Cardiovascular: Regular rate and rhythm, no murmurs / rubs / gallops. No extremity edema.  Extremities warm. Abdomen: no tenderness, no masses palpated. No hepatosplenomegaly. Bowel sounds positive.  Musculoskeletal: no clubbing / cyanosis. No joint deformity upper and lower extremities. Good ROM, no contractures. Normal muscle tone.  Skin: Skin bruising to left lateral hip area from fall, no rashes, lesions, ulcers. No induration Neurologic: No facial asymmetry, moves extremities spontaneously, speech fluent.  Psychiatric: Normal judgment and insight. Alert and oriented x 3. Normal mood.   Labs on Admission: I have personally reviewed following labs and imaging studies  CBC: Recent Labs  Lab 01/05/24 1206  WBC 9.3  NEUTROABS 7.4  HGB 12.6*  HCT 35.6*  MCV 83.4  PLT 238   Basic Metabolic Panel: Recent Labs  Lab 01/05/24 1206  NA 126*  K 3.9  CL 90*  CO2 17*  GLUCOSE  108*  BUN 88*  CREATININE 7.67*  CALCIUM  7.4*   Cardiac Enzymes: Recent Labs  Lab 01/05/24 1206  CKTOTAL 27,024*   Radiological Exams on Admission: US  Venous Img Lower Unilateral Left Result Date: 01/05/2024 CLINICAL DATA:  58 year old male with history of left lower extremity pain and swelling. EXAM: LEFT LOWER EXTREMITY VENOUS DOPPLER ULTRASOUND TECHNIQUE: Gray-scale sonography with graded compression, as well as color Doppler and duplex ultrasound were performed to evaluate the left lower extremity  deep venous systems from the level of the common femoral vein and including the common femoral, femoral, profunda femoral, popliteal and calf veins including the posterior tibial, peroneal and gastrocnemius veins when visible. Spectral Doppler was utilized to evaluate flow at rest and with distal augmentation maneuvers in the common femoral, femoral and popliteal veins. The contralateral common femoral vein was also evaluated for comparison. COMPARISON:  None Available. FINDINGS: LEFT LOWER EXTREMITY Common Femoral Vein: No evidence of thrombus. Normal compressibility, respiratory phasicity and response to augmentation. Central Greater Saphenous Vein: No evidence of thrombus. Normal compressibility and flow on color Doppler imaging. Central Profunda Femoral Vein: No evidence of thrombus. Normal compressibility and flow on color Doppler imaging. Femoral Vein: No evidence of thrombus. Normal compressibility, respiratory phasicity and response to augmentation. Popliteal Vein: No evidence of thrombus. Normal compressibility, respiratory phasicity and response to augmentation. Calf Veins: No evidence of thrombus. Normal compressibility and flow on color Doppler imaging. Other Findings:  None. RIGHT LOWER EXTREMITY Common Femoral Vein: No evidence of thrombus. Normal compressibility, respiratory phasicity and response to augmentation. IMPRESSION: No evidence of left lower extremity deep venous thrombosis. Ester Sides, MD [facility] Electronically Signed   By: Ester Sides M.D.   On: 01/05/2024 14:55   CT Hip Left Wo Contrast Result Date: 01/05/2024 CLINICAL DATA:  Low back and left hip pain after falling. EXAM: CT OF THE LEFT HIP WITHOUT CONTRAST TECHNIQUE: Multidetector CT imaging of the left hip was performed according to the standard protocol. Multiplanar CT image reconstructions were also generated. RADIATION DOSE REDUCTION: This exam was performed according to the departmental dose-optimization program which includes automated exposure control, adjustment of the mA and/or kV according to patient size and/or use of iterative reconstruction technique. COMPARISON:  Same day radiographs. FINDINGS: Bones/Joint/Cartilage No evidence of acute fracture, dislocation or femoral head osteonecrosis. No significant left hip arthropathy or effusion. The visualized left sacroiliac joint appears unremarkable. Ligaments Suboptimally assessed by CT. Muscles and Tendons No focal muscular abnormalities are identified by CT. Soft tissues Mild subcutaneous edema anterolaterally in the proximal thigh. No organized fluid collection, foreign body or soft tissue emphysema demonstrated. IMPRESSION: 1. No evidence of acute left hip fracture or dislocation. 2. Mild subcutaneous edema anterolaterally in the proximal thigh. No organized fluid collection, foreign body or soft tissue emphysema demonstrated. Electronically Signed   By: Elsie Perone M.D.   On: 01/05/2024 12:20   CT Lumbar Spine Wo Contrast Result Date: 01/05/2024 CLINICAL DATA:  Back trauma, no prior imaging (Age >= 16y) fall, low back and hip pain Status post fall with low back and left hip pain. EXAM: CT LUMBAR SPINE WITHOUT CONTRAST TECHNIQUE: Multidetector CT imaging of the lumbar spine was performed without intravenous contrast administration. Multiplanar CT image reconstructions were also generated. RADIATION  DOSE REDUCTION: This exam was performed according to the departmental dose-optimization program which includes automated exposure control, adjustment of the mA and/or kV according to patient size and/or use of iterative reconstruction technique. COMPARISON:  Chest CT 12/26/2014, lumbar spine radiographs 11/13/2017 and lumbar MRI 02/08/2018. 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 This numbering is concordant with prior imaging. Alignment: Normal. Vertebrae: No evidence of acute fracture or traumatic subluxation. No evidence of pars defect or aggressive osseous lesion. Segmented right L1 transverse process. Paraspinal and other soft tissues: No acute paraspinal findings. Mild aortic and iliac atherosclerosis. Dependent subcutaneous edema in the mid back. Disc levels: No acute disc space findings  are identified. There is stable mild loss of disc height with mild disc bulging at L3-4. Moderate facet degenerative changes are present bilaterally at L5-S1. No significant disc herniation or high-grade foraminal narrowing demonstrated. IMPRESSION: 1. No evidence of acute lumbar spine fracture, traumatic subluxation or static signs of instability. 2. Stable mild lumbar spondylosis and moderate facet degenerative changes at L5-S1. 3.  Aortic Atherosclerosis (ICD10-I70.0). Electronically Signed   By: Elsie Perone M.D.   On: 01/05/2024 12:16   DG Hip Unilat With Pelvis 2-3 Views Left Result Date: 01/05/2024 CLINICAL DATA:  Left hip pain after fall 5 days ago. EXAM: DG HIP (WITH OR WITHOUT PELVIS) 2-3V LEFT COMPARISON:  None Available. FINDINGS: There is no evidence of hip fracture or dislocation. Femoral heads are seated within the acetabula. Left greater trochanteric enthesopathy. Sacroiliac joints and pubic symphysis are anatomically aligned. IMPRESSION: No acute osseous abnormality. Electronically Signed   By: Harrietta Sherry M.D.   On: 01/05/2024 11:21   EKG:  Independently reviewed.  Sinus rhythm, rate 68, QTc 448.  No significant change from prior.  Assessment/Plan Principal Problem:   AKI (acute kidney injury) (HCC) Active Problems:   Rhabdomyolysis   Nicotine dependence, cigarettes, with other nicotine-induced disorders   Chronic obstructive pulmonary disease (HCC)   History of narcotic addiction (HCC)  Assessment and Plan: * AKI (acute kidney injury) (HCC) Creatinine markedly elevated at 7.67, no known history of kidney disease.  Creatinine last checked 7 years ago was normal at 0.6.  AKI likely secondary to severe rhabdomyolysis, and dehydration.  He is not on any medications.  UA with large hemoglobin.  - Now total of 3.5 L bolus given, as he has barely voided, and renal function has not changed on repeat -Continue bicarb drip 75 cc/h - and N/s 75cc/hr x 1 day -I talked to nephrologist Dr. Melia, recommended continuing bicarb drip to alkalinize the urine, hydrate, get renal ultrasound to rule out obstruction.  If no improvement in renal function in the morning, reconsult nephrology.  Rhabdomyolysis Severe rhabdomyolysis with CK of 27,024. - Hydrate per above with sodium bicarb and normal saline  Anion gap metabolic acidosis-serum bicarb of 17, anion gap of 19.  Likely due to azotemia from AKI.  Vomiting likely from azotemia.  Fall- off bed onto left hip with persistent left hip pain, on exam there is bruising to the left lateral hip area.  CT shows mild subcutaneous edema anterolaterally in the proximal thigh.  No organized fluid collection, foreign body or soft tissue emphysema.   Chronic obstructive pulmonary disease (HCC) Stable.   DVT prophylaxis: Heparin  Code Status: Full code Family Communication: None at bedside Disposition Plan: > 2 days Consults called: None Admission status: Inpt tele I certify that at the point of admission it is my clinical judgment that the patient will require inpatient hospital care spanning beyond  2 midnights from the point of admission due to high intensity of service, high risk for further deterioration and high frequency of surveillance required.   Author: Tully FORBES Carwin, MD 01/05/2024 9:54 PM  For on call review [email or web address].

## 2024-01-05 NOTE — Assessment & Plan Note (Addendum)
 Creatinine markedly elevated at 7.67, no known history of kidney disease.  Creatinine last checked 7 years ago was normal at 0.6.  AKI likely secondary to severe rhabdomyolysis, and dehydration.  He is not on any medications.  UA with large hemoglobin.  - Now total of 3.5 L bolus given, as he has barely voided, and renal function has not changed on repeat -Continue bicarb drip 75 cc/h - and N/s 75cc/hr x 1 day - Will place Foley temporarily -I talked to nephrologist Dr. Melia, recommended continuing bicarb drip to alkalinize the urine, hydrate, get renal ultrasound to rule out obstruction.  If no improvement in renal function in the morning, reconsult nephrology.

## 2024-01-05 NOTE — ED Provider Notes (Signed)
 [facility]GENC[facility] DEPARTMENT AT [facility] Provider Note   CSN: [id number] Arrival date & time: 01/05/24  1009     Patient presents with: Gene Smith Gene Smith is a 59 y.o. male.    Fall Pertinent negatives include no chest pain and no abdominal pain.       Gene Smith is a 58 y.o. male who presents to the Emergency Department complaining of left hip pain and left thigh pain x 5 days.  States that he accidentally rolled out of bed landing on his left hip and leg.  Fall was approximately 3 feet onto a carpeted floor.  Has had gradually worsening pain from his hip that radiates into his left leg.  He has been experiencing swelling of his left thigh.  He has numbness to his left foot for 2 to 3 days.  Pain is worse with weightbearing and with movement.  He also states he is in having decreased appetite nausea and intermittent vomiting which he states is secondary to his level of pain.  He also admits that he has not been drinking as much fluids recently because he did not want to have to get up to go urinate. He has not taken any over-the-counter pain relievers for his symptoms.  He denies any abdominal pain, fever, chills, flank pain  symptoms. He states numbness is localized to his left foot only.    Prior to Admission medications   Medication Sig Start Date End Date Taking? Authorizing Provider  albuterol  (PROVENTIL  HFA;VENTOLIN  HFA) 108 (90 Base) MCG/ACT inhaler Inhale 2 puffs into the lungs every 6 (six) hours as needed for wheezing or shortness of breath. 08/18/16   Comer Kirsch, PA-C  aspirin 81 MG chewable tablet Chew by mouth daily.    [provider]  atorvastatin  (LIPITOR) 20 MG tablet Take 1 tablet (20 mg total) by mouth daily. 08/12/16   Comer Kirsch, PA-C  cholecalciferol  (VITAMIN D ) 1000 units tablet Take 5,000 Units by mouth daily.    [provider]  gabapentin  (NEURONTIN ) 300 MG capsule Take 300 mg by mouth 3 (three) times daily.      [provider]  ibuprofen (ADVIL,MOTRIN) 200 MG tablet Take 400 mg by mouth every 6 (six) hours as needed for moderate pain.    [provider]  METHADONE HCL PO Take 190 mg by mouth daily.     [provider]  mometasone -formoterol  (DULERA) 100-5 MCG/ACT AERO Inhale 2 puffs into the lungs 2 (two) times daily. 08/12/16   Comer Kirsch, PA-C  tamsulosin (FLOMAX) 0.4 MG CAPS capsule Take 0.4 mg by mouth.    [provider]  traZODone (DESYREL) 50 MG tablet Take 50 mg by mouth at bedtime.    [provider]    Allergies: Pollen extract    Review of Systems  Constitutional:  Negative for chills and fever.  Respiratory:  Negative for cough.   Cardiovascular:  Negative for chest pain.  Gastrointestinal:  Positive for nausea and vomiting. Negative for abdominal pain, constipation and diarrhea.  Genitourinary:  Negative for decreased urine volume, dysuria, flank pain, penile swelling, scrotal swelling and testicular pain.  Musculoskeletal:  Positive for back pain.  Skin:  Negative for color change and wound.  Neurological:  Positive for numbness (left foot). Negative for weakness.  Hematological:  Does not bruise/bleed easily.    Updated Vital Signs BP 120/75 (BP Location: Left Arm)   Pulse 84   Temp 98.3 F (36.8 C) (Oral)  Resp 16   Ht 5' 10 (1.778 m)   Wt 86.2 kg   SpO2 92%   BMI 27.26 kg/m   Physical Exam Vitals and nursing note reviewed.  Constitutional:      Appearance: He is not toxic-appearing.  HENT:     Head: Atraumatic.     Mouth/Throat:     Mouth: Mucous membranes are dry.  Cardiovascular:     Rate and Rhythm: Normal rate and regular rhythm.     Pulses: Normal pulses.  Pulmonary:     Effort: Pulmonary effort is normal.     Breath sounds: Normal breath sounds.  Abdominal:     Palpations: Abdomen is soft.     Tenderness: There is no abdominal tenderness.  Musculoskeletal:        General: Swelling present.      Left lower leg: Edema present.     Comments: Ttp of the lateral left hip.  Pain with attempted ROM.  Left thigh is edematous compared to right.  No bony deformity of the leg or hip.  No excessive warmth, erythema or ecchymosis of the hip or leg.  No edema of the lower leg.  Gross sensation of the left foot intact, extremity is warm and pink with good cap refill  Skin:    General: Skin is warm.     Capillary Refill: Capillary refill takes less than 2 seconds.     Findings: No bruising or erythema.  Neurological:     General: No focal deficit present.     Mental Status: He is alert.     Sensory: No sensory deficit.     Motor: No weakness.     (all labs ordered are listed, but only abnormal results are displayed) Labs Reviewed  CBC WITH DIFFERENTIAL/PLATELET - Abnormal; Notable for the following components:      Result Value   Hemoglobin 12.6 (*)    HCT 35.6 (*)    Abs Immature Granulocytes 0.09 (*)    All other components within normal limits  BASIC METABOLIC PANEL WITH GFR - Abnormal; Notable for the following components:   Sodium 126 (*)    Chloride 90 (*)    CO2 17 (*)    Glucose, Bld 108 (*)    BUN 88 (*)    Creatinine, Ser 7.67 (*)    Calcium  7.4 (*)    GFR, Estimated 7 (*)    Anion gap 19 (*)    All other components within normal limits  CK - Abnormal; Notable for the following components:   Total CK 27,024 (*)    All other components within normal limits  URINALYSIS, ROUTINE W REFLEX MICROSCOPIC    EKG: None  Radiology: US  Venous Img Lower Unilateral Left Result Date: 01/05/2024 CLINICAL DATA:  58 year old male with history of left lower extremity pain and swelling. EXAM: LEFT LOWER EXTREMITY VENOUS DOPPLER ULTRASOUND TECHNIQUE: Gray-scale sonography with graded compression, as well as color Doppler and duplex ultrasound were performed to evaluate the left lower extremity deep venous systems from the level of the common femoral vein and including the common femoral,  femoral, profunda femoral, popliteal and calf veins including the posterior tibial, peroneal and gastrocnemius veins when visible. Spectral Doppler was utilized to evaluate flow at rest and with distal augmentation maneuvers in the common femoral, femoral and popliteal veins. The contralateral common femoral vein was also evaluated for comparison. COMPARISON:  None Available. FINDINGS: LEFT LOWER EXTREMITY Common Femoral Vein: No evidence of thrombus. Normal compressibility, respiratory phasicity  and response to augmentation. Central Greater Saphenous Vein: No evidence of thrombus. Normal compressibility and flow on color Doppler imaging. Central Profunda Femoral Vein: No evidence of thrombus. Normal compressibility and flow on color Doppler imaging. Femoral Vein: No evidence of thrombus. Normal compressibility, respiratory phasicity and response to augmentation. Popliteal Vein: No evidence of thrombus. Normal compressibility, respiratory phasicity and response to augmentation. Calf Veins: No evidence of thrombus. Normal compressibility and flow on color Doppler imaging. Other Findings:  None. RIGHT LOWER EXTREMITY Common Femoral Vein: No evidence of thrombus. Normal compressibility, respiratory phasicity and response to augmentation. IMPRESSION: No evidence of left lower extremity deep venous thrombosis. Ester Sides, MD [facility] Electronically Signed   By: Ester Sides M.D.   On: 01/05/2024 14:55   CT Hip Left Wo Contrast Result Date: 01/05/2024 CLINICAL DATA:  Low back and left hip pain after falling. EXAM: CT OF THE LEFT HIP WITHOUT CONTRAST TECHNIQUE: Multidetector CT imaging of the left hip was performed according to the standard protocol. Multiplanar CT image reconstructions were also generated. RADIATION DOSE REDUCTION: This exam was performed according to the departmental dose-optimization program which includes automated exposure control,  adjustment of the mA and/or kV according to patient size and/or use of iterative reconstruction technique. COMPARISON:  Same day radiographs. FINDINGS: Bones/Joint/Cartilage No evidence of acute fracture, dislocation or femoral head osteonecrosis. No significant left hip arthropathy or effusion. The visualized left sacroiliac joint appears unremarkable. Ligaments Suboptimally assessed by CT. Muscles and Tendons No focal muscular abnormalities are identified by CT. Soft tissues Mild subcutaneous edema anterolaterally in the proximal thigh. No organized fluid collection, foreign body or soft tissue emphysema demonstrated. IMPRESSION: 1. No evidence of acute left hip fracture or dislocation. 2. Mild subcutaneous edema anterolaterally in the proximal thigh. No organized fluid collection, foreign body or soft tissue emphysema demonstrated. Electronically Signed   By: Elsie Perone M.D.   On: 01/05/2024 12:20   CT Lumbar Spine Wo Contrast Result Date: 01/05/2024 CLINICAL DATA:  Back trauma, no prior imaging (Age >= 16y) fall, low back and hip pain Status post fall with low back and left hip pain. EXAM: CT LUMBAR SPINE WITHOUT CONTRAST TECHNIQUE: Multidetector CT imaging of the lumbar spine was performed without intravenous contrast administration. Multiplanar CT image reconstructions were also generated. RADIATION DOSE REDUCTION: This exam was performed according to the departmental dose-optimization program which includes automated exposure control, adjustment of the mA and/or kV according to patient size and/or use of iterative reconstruction technique. COMPARISON:  Chest CT 12/26/2014, lumbar spine radiographs 11/13/2017 and lumbar MRI 02/08/2018. 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 This numbering is concordant with prior imaging. Alignment: Normal. Vertebrae: No evidence of acute fracture or traumatic subluxation. No evidence of pars defect or  aggressive osseous lesion. Segmented right L1 transverse process. Paraspinal and other soft tissues: No acute paraspinal findings. Mild aortic and iliac atherosclerosis. Dependent subcutaneous edema in the mid back. Disc levels: No acute disc space findings are identified. There is stable mild loss of disc height with mild disc bulging at L3-4. Moderate facet degenerative changes are present bilaterally at L5-S1. No significant disc herniation or high-grade foraminal narrowing demonstrated. IMPRESSION: 1. No evidence of acute lumbar spine fracture, traumatic subluxation or static signs of instability. 2. Stable mild lumbar spondylosis and moderate facet degenerative changes at L5-S1. 3.  Aortic Atherosclerosis (ICD10-I70.0). Electronically Signed   By: Elsie Perone M.D.   On: 01/05/2024 12:16   DG  Hip Unilat With Pelvis 2-3 Views Left Result Date: 01/05/2024 CLINICAL DATA:  Left hip pain after fall 5 days ago. EXAM: DG HIP (WITH OR WITHOUT PELVIS) 2-3V LEFT COMPARISON:  None Available. FINDINGS: There is no evidence of hip fracture or dislocation. Femoral heads are seated within the acetabula. Left greater trochanteric enthesopathy. Sacroiliac joints and pubic symphysis are anatomically aligned. IMPRESSION: No acute osseous abnormality. Electronically Signed   By: Harrietta Sherry M.D.   On: 01/05/2024 11:21     Procedures   Medications Ordered in the ED  oxyCODONE -acetaminophen  (PERCOCET/ROXICET) 5-325 MG per tablet 1 tablet (has no administration in time range)                                    Medical Decision Making Patient here for evaluation of left hip pain after a fall out of bed 5 days ago.  Landed on his left hip and leg.  He is having pain radiating from his left buttock and hip area down to his ankle.  He describes having some numbness of his left foot as well.  No head injury or LOC.  Patient states that he has not been drinking fluids as he did not want to have to get up to go to  the restroom to urinate because of the pain in his hip.  Differential would include but not limited to fracture, dislocation, musculoskeletal injury, DVT also considered given swelling of the leg.  No clinical findings suggestive of limb ischemia.  No recent lab results or imaging available for comparison    Amount and/or Complexity of Data Reviewed Labs: ordered.    Details: Labs showed significant AKI with serum creatinine of 7.67, BUN also elevated at 88.  Potassium unremarkable.  Hyponatremic as well.  No leukocytosis.  CK significantly elevated at 27,000 Radiology: ordered.    Details: DVT study of the left lower extremity without evidence of DVT.  CT imaging of the lumbar spine without acute fracture or subluxation.  CT imaging of the left hip without evidence of fracture or dislocation. Discussion of management or test interpretation with external provider(s): Workup today shows rhabdomyolysis and AKI.  Given IV fluids here. Hip pain controlled after Percocet.    Discussed findings with [facility] hospitalist, Dr. Pearlean who agrees to admit  Risk Prescription drug management. Decision regarding hospitalization.        Final diagnoses:  AKI (acute kidney injury) [facility])  Traumatic rhabdomyolysis, initial encounter [facility])    ED Discharge Orders     None          Herlinda Milling, PA-C 01/05/24 1731    Dean Clarity, MD 01/08/24 1131

## 2024-01-05 NOTE — Assessment & Plan Note (Signed)
 Stable

## 2024-01-06 ENCOUNTER — Encounter (HOSPITAL_COMMUNITY): Payer: Self-pay | Admitting: Internal Medicine

## 2024-01-06 ENCOUNTER — Inpatient Hospital Stay (HOSPITAL_COMMUNITY): Payer: MEDICAID

## 2024-01-06 ENCOUNTER — Other Ambulatory Visit: Payer: Self-pay

## 2024-01-06 LAB — BASIC METABOLIC PANEL WITH GFR
Anion gap: 13 (ref 5–15)
Anion gap: 19 — ABNORMAL HIGH (ref 5–15)
BUN: 92 mg/dL — ABNORMAL HIGH (ref 6–20)
BUN: 93 mg/dL — ABNORMAL HIGH (ref 6–20)
CO2: 20 mmol/L — ABNORMAL LOW (ref 22–32)
CO2: 17 mmol/L — ABNORMAL LOW (ref 22–32)
Calcium: 6.8 mg/dL — ABNORMAL LOW (ref 8.9–10.3)
Calcium: 7 mg/dL — ABNORMAL LOW (ref 8.9–10.3)
Chloride: 94 mmol/L — ABNORMAL LOW (ref 98–111)
Chloride: 94 mmol/L — ABNORMAL LOW (ref 98–111)
Creatinine, Ser: 7.44 mg/dL — ABNORMAL HIGH (ref 0.61–1.24)
Creatinine, Ser: 8.06 mg/dL — ABNORMAL HIGH (ref 0.61–1.24)
GFR, Estimated: 8 mL/min — ABNORMAL LOW
GFR, Estimated: 7 mL/min — ABNORMAL LOW (ref >60)
Glucose, Bld: 111 mg/dL — ABNORMAL HIGH (ref 70–99)
Glucose, Bld: 142 mg/dL — ABNORMAL HIGH (ref 70–99)
Potassium: 3.4 mmol/L — ABNORMAL LOW (ref 3.5–5.1)
Potassium: 3.2 mmol/L — ABNORMAL LOW (ref 3.5–5.1)
Sodium: 127 mmol/L — ABNORMAL LOW (ref 135–145)
Sodium: 130 mmol/L — ABNORMAL LOW (ref 135–145)

## 2024-01-06 LAB — CBC
HCT: 28.6 % — ABNORMAL LOW (ref 39.0–52.0)
Hemoglobin: 10.3 g/dL — ABNORMAL LOW (ref 13.0–17.0)
MCH: 29.5 pg (ref 26.0–34.0)
MCHC: 36 g/dL (ref 30.0–36.0)
MCV: 81.9 fL (ref 80.0–100.0)
Platelets: 234 K/uL (ref 150–400)
RBC: 3.49 MIL/uL — ABNORMAL LOW (ref 4.22–5.81)
RDW: 12.9 % (ref 11.5–15.5)
WBC: 8.8 K/uL (ref 4.0–10.5)
nRBC: 0 % (ref 0.0–0.2)

## 2024-01-06 LAB — CK: Total CK: 11919 U/L — ABNORMAL HIGH (ref 49–397)

## 2024-01-06 LAB — HIV ANTIBODY (ROUTINE TESTING W REFLEX): HIV Screen 4th Generation wRfx: NONREACTIVE

## 2024-01-06 MED ORDER — CALCIUM GLUCONATE-NACL 1-0.675 GM/50ML-% IV SOLN
1.0000 g | Freq: Once | INTRAVENOUS | Status: AC
  Administered 2024-01-06: 1000 mg via INTRAVENOUS
  Filled 2024-01-06: qty 50

## 2024-01-06 MED ORDER — SODIUM CHLORIDE 0.9 % IV SOLN: INTRAVENOUS | Status: DC

## 2024-01-06 MED ORDER — SODIUM CHLORIDE 0.9 % IV SOLN
INTRAVENOUS | Status: DC
Start: 2024-01-06 — End: 2024-01-06

## 2024-01-06 MED ORDER — OXYCODONE-ACETAMINOPHEN 5-325 MG PO TABS
1.0000 | ORAL_TABLET | ORAL | Status: DC | PRN
  Administered 2024-01-06 – 2024-01-09 (×14): 1 via ORAL
  Filled 2024-01-06 (×14): qty 1

## 2024-01-06 MED ORDER — POTASSIUM CHLORIDE CRYS ER 20 MEQ PO TBCR
40.0000 meq | EXTENDED_RELEASE_TABLET | Freq: Once | ORAL | Status: AC
Start: 2024-01-06 — End: 2024-01-06
  Administered 2024-01-06: 40 meq via ORAL
  Filled 2024-01-06: qty 2

## 2024-01-06 MED ORDER — INFLUENZA VIRUS VACC SPLIT PF (FLUZONE) 0.5 ML IM SUSY
0.5000 mL | PREFILLED_SYRINGE | INTRAMUSCULAR | Status: DC
Start: 2024-01-07 — End: 2024-01-09

## 2024-01-06 NOTE — TOC CM/SW Note (Signed)
 Transition of Care [facility]) - Inpatient Brief Assessment   Patient Details  Name: Gene Smith MRN: [id number] Date of Birth: 06/30/1965  Transition of [facility] CM/SW Contact:    Lucie Lunger, LCSWA Phone Number: 01/06/2024, 9:15 AM   Clinical Narrative: Transition of Care Department [facility]) has reviewed patient and no TOC needs have been identified at this time. We will continue to monitor patient advancement through interdiciplinary progression rounds. If new patient transition needs arise, please place a TOC consult.  Transition of Care Asessment: Insurance and Status: Insurance coverage has been reviewed Patient has primary care physician: Yes Home environment has been reviewed: From home Prior level of function:: Independent Prior/Current Home Services: No current home services Social Drivers of Health Review: SDOH reviewed no interventions necessary Readmission risk has been reviewed: Yes Transition of care needs: no transition of care needs at this time

## 2024-01-06 NOTE — ED Notes (Signed)
 Pt did states he did have a BM this morning--LBM 01/06/24

## 2024-01-06 NOTE — ED Notes (Signed)
 Pt given diet drink and sandwich per request.

## 2024-01-06 NOTE — Progress Notes (Signed)
 PROGRESS NOTE    Gene Smith  FMW:[id number] DOB: Oct 27, 1965 DOA: 01/05/2024 PCP: Benjamin Raina Elizabeth, NP   Brief Narrative:    Assessment & Plan:   Principal Problem:   AKI (acute kidney injury) (HCC) Active Problems:   Rhabdomyolysis   Nicotine dependence, cigarettes, with other nicotine-induced disorders   Chronic obstructive pulmonary disease (HCC)   History of narcotic addiction (HCC)   Assessment and Plan: * AKI (acute kidney injury) (HCC) Creatinine markedly elevated at 7.67, no known history of kidney disease.  Creatinine last checked 7 years ago was normal at 0.6.  AKI likely secondary to severe rhabdomyolysis, and dehydration.  He is not on any medications.  UA with large hemoglobin.  - Patient received total of 3.5 L bolus on admission and is continued on bicarb drip as well as NS 75 cc/h.  Creatinine remains same at 7.5.  Patient is making urine.  He does not feel short of breath or has any evidence of pulmonary edema.  Has hypokalemia which will be repleted today. Will repeat BMP later today as well as CK level. Currently has no acute indication for dialysis. Renal ultrasound report is pending.  Rhabdomyolysis Severe rhabdomyolysis with CK of 27,024. - Hydrate per above with sodium bicarb and normal saline -Follow-up CK today   Anion gap metabolic acidosis-serum bicarb of 17, anion gap of 19.  Likely due to azotemia from AKI.  Vomiting likely from azotemia.   Fall- off bed onto left hip with persistent left hip pain, on exam there is bruising to the left lateral hip area.  CT shows mild subcutaneous edema anterolaterally in the proximal thigh.  No organized fluid collection, foreign body or soft tissue emphysema.   Left lower extremity pain: Patient reports of some numbness and pain left lower extremity.  He has some edema, Doppler is negative for DVT.  Clinically has no cellulitis.  Has some bruise in the left upper thigh.  Numbness could be secondary to  fall and pain.  Will continue to monitor.  Extremity is neurovascularly intact. Continue Norco, frequency increased per his request.  Patient states he no longer takes methadone at home.   Chronic obstructive pulmonary disease (HCC) Stable.     DVT prophylaxis: Heparin  Code Status: Full code Family Communication: None at bedside Disposition Plan: > 2 days Consults called: None Admission status: Inpt tele I certify that at the point of admission it is my clinical judgment that the patient will require inpatient hospital care spanning beyond 2 midnights from the point of admission due to high intensity of service, high risk for further deterioration and high frequency of surveillance required.       Remains inpatient appropriate because: Renal failure requiring daily labs monitoring as well as IV infusion of fluids and close monitoring   Subjective: Patient seen and examined at the bedside.  He is main complaint is left hip pain with some numbness on the lower extremity.  He does have edema of the left leg.  He does have some bruises on the left upper thigh as well.  Vital signs are stable.  He denies any chest pain, shortness of breath, dizziness, fever or chills.  Objective: Vitals:   01/06/24 0800 01/06/24 0901 01/06/24 0950 01/06/24 1017  BP: 125/73  123/74   Pulse: 68  62   Resp: 17  18   Temp:  (!) 96.2 F (35.7 C) 97.9 F (36.6 C)   TempSrc:  Temporal Oral   SpO2: 93%  100%  Weight:    84.8 kg  Height:    5' 10 (1.778 m)    Intake/Output Summary (Last 24 hours) at 01/06/2024 1109 Last data filed at 01/06/2024 0951 Gross per 24 hour  Intake 4480 ml  Output 600 ml  Net 3880 ml   Filed Weights   01/05/24 1025 01/06/24 1017  Weight: 86.2 kg 84.8 kg    Examination:  General exam: Appears calm and comfortable  Respiratory system: Bilateral decreased breath sounds at bases Cardiovascular system: S1 & S2 heard, Rate controlled Gastrointestinal system: Abdomen is  nondistended, soft and nontender. Normal bowel sounds heard. Extremities: Left lower extremity edema up to upper thigh, small bruise noted on the lateral thigh, nontender to palpation. Central nervous system: Alert and oriented. No focal neurological deficits. Moving extremities Skin: No rashes, lesions or ulcers Psychiatry: Judgement and insight appear normal. Mood & affect appropriate.     Data Reviewed: I have personally reviewed following labs and imaging studies  CBC: Recent Labs  Lab 01/05/24 1206 01/06/24 0340  WBC 9.3 8.8  NEUTROABS 7.4  --   HGB 12.6* 10.3*  HCT 35.6* 28.6*  MCV 83.4 81.9  PLT 238 234   Basic Metabolic Panel: Recent Labs  Lab 01/05/24 1206 01/05/24 2033 01/06/24 0340  NA 126* 127* 127*  K 3.9 3.4* 3.4*  CL 90* 93* 94*  CO2 17* 18* 20*  GLUCOSE 108* 163* 111*  BUN 88* 86* 92*  CREATININE 7.67* 7.50* 7.44*  CALCIUM  7.4* 6.9* 6.8*   GFR: Estimated Creatinine Clearance: 10.9 mL/min (A) (by C-G formula based on SCr of 7.44 mg/dL (H)). Liver Function Tests: No results for input(s): AST, ALT, ALKPHOS, BILITOT, PROT, ALBUMIN in the last 168 hours. No results for input(s): LIPASE, AMYLASE in the last 168 hours. No results for input(s): AMMONIA in the last 168 hours. Coagulation Profile: No results for input(s): INR, PROTIME in the last 168 hours. Cardiac Enzymes: Recent Labs  Lab 01/05/24 1206  CKTOTAL 27,024*   BNP (last 3 results) No results for input(s): PROBNP in the last 8760 hours. HbA1C: No results for input(s): HGBA1C in the last 72 hours. CBG: No results for input(s): GLUCAP in the last 168 hours. Lipid Profile: No results for input(s): CHOL, HDL, LDLCALC, TRIG, CHOLHDL, LDLDIRECT in the last 72 hours. Thyroid Function Tests: No results for input(s): TSH, T4TOTAL, FREET4, T3FREE, THYROIDAB in the last 72 hours. Anemia Panel: No results for input(s): VITAMINB12, FOLATE,  FERRITIN, TIBC, IRON, RETICCTPCT in the last 72 hours. Sepsis Labs: No results for input(s): PROCALCITON, LATICACIDVEN in the last 168 hours.  No results found for this or any previous visit (from the past 240 hours).       Radiology Studies: US  RENAL Result Date: 01/06/2024 CLINICAL DATA:  [id number] AKI (acute kidney injury) (HCC) [id number] EXAM: RENAL / URINARY TRACT ULTRASOUND COMPLETE COMPARISON:  February 25, 2017 FINDINGS: Right Kidney: Renal measurements: 13.2 x 6 x 6.2 cm = volume: 258 mL.Normal echogenicity. No mass. No hydronephrosis or nephrolithiasis. Left Kidney: Renal measurements: 13.5 x 6.4 x 6.2 cm = volume: 289 mL. Normal echogenicity. No mass. No hydronephrosis or nephrolithiasis. Bladder: Appears normal for degree of bladder distention. Other: None. IMPRESSION: No hydronephrosis or nephrolithiasis. Electronically Signed   By: Rogelia Myers M.D.   On: 01/06/2024 10:20   US  Venous Img Lower Unilateral Left Result Date: 01/05/2024 CLINICAL DATA:  58 year old male with history of left lower extremity pain and swelling. EXAM: LEFT LOWER EXTREMITY VENOUS DOPPLER ULTRASOUND TECHNIQUE: Gray-scale  sonography with graded compression, as well as color Doppler and duplex ultrasound were performed to evaluate the left lower extremity deep venous systems from the level of the common femoral vein and including the common femoral, femoral, profunda femoral, popliteal and calf veins including the posterior tibial, peroneal and gastrocnemius veins when visible. Spectral Doppler was utilized to evaluate flow at rest and with distal augmentation maneuvers in the common femoral, femoral and popliteal veins. The contralateral common femoral vein was also evaluated for comparison. COMPARISON:  None Available. FINDINGS: LEFT LOWER EXTREMITY Common Femoral Vein: No evidence of thrombus. Normal compressibility, respiratory phasicity and response to augmentation. Central Greater Saphenous Vein: No  evidence of thrombus. Normal compressibility and flow on color Doppler imaging. Central Profunda Femoral Vein: No evidence of thrombus. Normal compressibility and flow on color Doppler imaging. Femoral Vein: No evidence of thrombus. Normal compressibility, respiratory phasicity and response to augmentation. Popliteal Vein: No evidence of thrombus. Normal compressibility, respiratory phasicity and response to augmentation. Calf Veins: No evidence of thrombus. Normal compressibility and flow on color Doppler imaging. Other Findings:  None. RIGHT LOWER EXTREMITY Common Femoral Vein: No evidence of thrombus. Normal compressibility, respiratory phasicity and response to augmentation. IMPRESSION: No evidence of left lower extremity deep venous thrombosis. Ester Sides, MD [facility] Electronically Signed   By: Ester Sides M.D.   On: 01/05/2024 14:55   CT Hip Left Wo Contrast Result Date: 01/05/2024 CLINICAL DATA:  Low back and left hip pain after falling. EXAM: CT OF THE LEFT HIP WITHOUT CONTRAST TECHNIQUE: Multidetector CT imaging of the left hip was performed according to the standard protocol. Multiplanar CT image reconstructions were also generated. RADIATION DOSE REDUCTION: This exam was performed according to the departmental dose-optimization program which includes automated exposure control, adjustment of the mA and/or kV according to patient size and/or use of iterative reconstruction technique. COMPARISON:  Same day radiographs. FINDINGS: Bones/Joint/Cartilage No evidence of acute fracture, dislocation or femoral head osteonecrosis. No significant left hip arthropathy or effusion. The visualized left sacroiliac joint appears unremarkable. Ligaments Suboptimally assessed by CT. Muscles and Tendons No focal muscular abnormalities are identified by CT. Soft tissues Mild subcutaneous edema anterolaterally in the proximal thigh. No organized fluid  collection, foreign body or soft tissue emphysema demonstrated. IMPRESSION: 1. No evidence of acute left hip fracture or dislocation. 2. Mild subcutaneous edema anterolaterally in the proximal thigh. No organized fluid collection, foreign body or soft tissue emphysema demonstrated. Electronically Signed   By: Elsie Perone M.D.   On: 01/05/2024 12:20   CT Lumbar Spine Wo Contrast Result Date: 01/05/2024 CLINICAL DATA:  Back trauma, no prior imaging (Age >= 16y) fall, low back and hip pain Status post fall with low back and left hip pain. EXAM: CT LUMBAR SPINE WITHOUT CONTRAST TECHNIQUE: Multidetector CT imaging of the lumbar spine was performed without intravenous contrast administration. Multiplanar CT image reconstructions were also generated. RADIATION DOSE REDUCTION: This exam was performed according to the departmental dose-optimization program which includes automated exposure control, adjustment of the mA and/or kV according to patient size and/or use of iterative reconstruction technique. COMPARISON:  Chest CT 12/26/2014, lumbar spine radiographs 11/13/2017 and lumbar MRI 02/08/2018. 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 This numbering is concordant with prior imaging. Alignment: Normal. Vertebrae: No evidence of acute fracture or traumatic subluxation. No evidence of pars defect or aggressive osseous lesion. Segmented right L1 transverse process. Paraspinal and other soft tissues: No acute paraspinal  findings. Mild aortic and iliac atherosclerosis. Dependent subcutaneous edema in the mid back. Disc levels: No acute disc space findings are identified. There is stable mild loss of disc height with mild disc bulging at L3-4. Moderate facet degenerative changes are present bilaterally at L5-S1. No significant disc herniation or high-grade foraminal narrowing demonstrated. IMPRESSION: 1. No evidence of acute lumbar spine fracture, traumatic  subluxation or static signs of instability. 2. Stable mild lumbar spondylosis and moderate facet degenerative changes at L5-S1. 3.  Aortic Atherosclerosis (ICD10-I70.0). Electronically Signed   By: Elsie Perone M.D.   On: 01/05/2024 12:16   DG Hip Unilat With Pelvis 2-3 Views Left Result Date: 01/05/2024 CLINICAL DATA:  Left hip pain after fall 5 days ago. EXAM: DG HIP (WITH OR WITHOUT PELVIS) 2-3V LEFT COMPARISON:  None Available. FINDINGS: There is no evidence of hip fracture or dislocation. Femoral heads are seated within the acetabula. Left greater trochanteric enthesopathy. Sacroiliac joints and pubic symphysis are anatomically aligned. IMPRESSION: No acute osseous abnormality. Electronically Signed   By: Harrietta Sherry M.D.   On: 01/05/2024 11:21        Scheduled Meds:  Chlorhexidine  Gluconate Cloth  6 each Topical Daily   heparin   5,000 Units Subcutaneous Q8H   [START ON 01/07/2024] influenza vac split trivalent PF  0.5 mL Intramuscular Tomorrow-1000   Continuous Infusions:  sodium chloride  75 mL/hr at 01/06/24 1048   sodium bicarbonate  150 mEq in dextrose  5 % 1,150 mL infusion 75 mL/hr at 01/06/24 0241          Derryl Duval, MD [facility] 01/06/2024, 11:09 AM

## 2024-01-06 NOTE — Plan of Care (Signed)

## 2024-01-07 DIAGNOSIS — M79672 Pain in left foot: Secondary | ICD-10-CM

## 2024-01-07 DIAGNOSIS — N179 Acute kidney failure, unspecified: Secondary | ICD-10-CM | POA: Diagnosis not present

## 2024-01-07 LAB — BASIC METABOLIC PANEL WITH GFR
Anion gap: 14 (ref 5–15)
BUN: 91 mg/dL — ABNORMAL HIGH (ref 6–20)
CO2: 20 mmol/L — ABNORMAL LOW (ref 22–32)
Calcium: 7.2 mg/dL — ABNORMAL LOW (ref 8.9–10.3)
Chloride: 95 mmol/L — ABNORMAL LOW (ref 98–111)
Creatinine, Ser: 7.67 mg/dL — ABNORMAL HIGH (ref 0.61–1.24)
GFR, Estimated: 7 mL/min — ABNORMAL LOW (ref >60)
Glucose, Bld: 104 mg/dL — ABNORMAL HIGH (ref 70–99)
Potassium: 3.5 mmol/L (ref 3.5–5.1)
Sodium: 129 mmol/L — ABNORMAL LOW (ref 135–145)

## 2024-01-07 MED ORDER — SODIUM BICARBONATE 8.4 % IV SOLN
INTRAVENOUS | Status: AC
  Filled 2024-01-07: qty 150

## 2024-01-07 NOTE — Evaluation (Signed)
 Physical Therapy Evaluation Patient Details Name: Gene Smith MRN: [id number] DOB: 08-Dec-1965 Today's Date: 01/07/2024  History of Present Illness  Gene Smith is a 58 y.o. male with medical history significant for tobacco abuse, COPD, depression.  Patient was brought to the ED via EMS with complains of left hip pain.  Patient reports 5 days ago on Tuesday 9/11, he fell out of bed.  This is not unusual he tells me because he sleeps at the edge of the bed, and his bed is quite high of the floor, and he has rolled out of bed like this before. He fell onto his left hip and has had subsequent severe pain to the left hip radiating down to his leg, pain was so severe he he did not ambulate, and has been lying in bed, he reports vomiting about 6-7 times over the past few days due to severe pain, diarrhea.  No abdominal pain.  No fevers no chills.  He also barely drank water so he would not have to get up to use the bathroom.  Reports poor oral intake over the past few days as he lives with his mother.  Reports he last had blood work about 6 months ago by his outpatient provider and it was normal.  He is not on any medications.   Clinical Impression  Pt was agreeable to today's PT evaluation. He was modified independent with bed mobility and sit to stand transfers. However, he was unable to weight bear through his LLE due to pain and he heavily relied on the RW for ambulation. He was left in bed with the call bell within reach and the bed alarm set. Patient will benefit from continued skilled physical therapy in hospital and recommended venue below to increase strength, balance, endurance for safe ADLs and gait.         If plan is discharge home, recommend the following: A lot of help with walking and/or transfers;A little help with bathing/dressing/bathroom;Assistance with cooking/housework;Help with stairs or ramp for entrance   Can travel by private vehicle   Yes    Equipment Recommendations Rolling  walker (2 wheels)  Recommendations for Other Services       Functional Status Assessment Patient has had a recent decline in their functional status and demonstrates the ability to make significant improvements in function in a reasonable and predictable amount of time.     Precautions / Restrictions Precautions Precautions: Fall Recall of Precautions/Restrictions: Intact Restrictions Weight Bearing Restrictions Per Provider Order: No      Mobility  Bed Mobility Overal bed mobility: Modified Independent             General bed mobility comments: supine to sit and sit to supine    Transfers Overall transfer level: Modified independent Equipment used: Rolling walker (2 wheels)                    Ambulation/Gait Ambulation/Gait assistance: Contact guard assist Gait Distance (Feet): 3 Feet Assistive device: Rolling walker (2 wheels) Gait Pattern/deviations: Step-to pattern, Decreased stance time - left, Decreased stride length, Decreased weight shift to left Gait velocity: slow     General Gait Details: NWB on LLE due to pain  Stairs            Wheelchair Mobility     Tilt Bed    Modified Rankin (Stroke Patients Only)       Balance Overall balance assessment: Needs assistance Sitting-balance support: No upper extremity supported, Feet  supported Sitting balance-Leahy Scale: Fair Sitting balance - Comments: seated EOB   Standing balance support: Bilateral upper extremity supported, During functional activity, Reliant on assistive device for balance Standing balance-Leahy Scale: Poor Standing balance comment: with RW                             Pertinent Vitals/Pain Pain Assessment Pain Assessment: 0-10 Pain Score: 10-Worst pain ever Pain Location: left leg Pain Descriptors / Indicators: Numbness Pain Intervention(s): Limited activity within patient's tolerance, Monitored during session, Repositioned    Home Living  Family/patient expects to be discharged to:: Private residence Living Arrangements: Parent Available Help at Discharge: Other (Comment) (None; 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 with demintia) Type of Home: Apartment Home Access: Level entry     Alternate Level Stairs-Number of Steps: 20 Home Layout: Two level Home Equipment: None      Prior Function Prior Level of Function : Independent/Modified Independent             Mobility Comments: Pt was independent with all household and community mobility ADLs Comments: Pt was independent with all ADL's     Extremity/Trunk Assessment   Upper Extremity Assessment Upper Extremity Assessment: Overall WFL for tasks assessed    Lower Extremity Assessment Lower Extremity Assessment: Generalized weakness    Cervical / Trunk Assessment Cervical / Trunk Assessment: Normal  Communication   Communication Communication: No apparent difficulties    Cognition Arousal: Alert Behavior During Therapy: WFL for tasks assessed/performed   PT - Cognitive impairments: No apparent impairments                         Following commands: Intact       Cueing Cueing Techniques: Verbal cues, Tactile cues     General Comments      Exercises     Assessment/Plan    PT Assessment Patient needs continued PT services  PT Problem List Decreased strength;Decreased activity tolerance;Decreased balance;Decreased mobility;Pain       PT Treatment Interventions DME instruction;Gait training;Stair training;Functional mobility training;Therapeutic activities;Therapeutic exercise;Balance training;Patient/family education    PT Goals (Current goals can be found in the Care Plan section)  Acute Rehab PT Goals Patient Stated Goal: Pt would like to return home PT Goal Formulation: With patient Time For Goal Achievement: 01/14/24 Potential to Achieve Goals: Good    Frequency Min 3X/week     Co-evaluation                AM-PAC PT 6 Clicks Mobility  Outcome Measure Help needed turning from your back to your side while in a flat bed without using bedrails?: None Help needed moving from lying on your back to sitting on the side of a flat bed without using bedrails?: None Help needed moving to and from a bed to a chair (including a wheelchair)?: A Lot Help needed standing up from a chair using your arms (e.g., wheelchair or bedside chair)?: A Lot Help needed to walk in hospital room?: A Lot Help needed climbing 3-5 steps with a railing? : Total 6 Click Score: 15    End of Session Equipment Utilized During Treatment: Gait belt Activity Tolerance: Patient limited by pain Patient left: in bed;with call bell/phone within reach;with bed alarm set   PT Visit Diagnosis: Other abnormalities of gait and mobility (R26.89);Muscle weakness (generalized) (M62.81);Difficulty in walking, not elsewhere classified (R26.2)    Time: 8949-8874 PT  Time Calculation (min) (ACUTE ONLY): 35 min   Charges:   PT Evaluation $PT Eval Moderate Complexity: 1 Mod PT Treatments $Therapeutic Activity: 23-37 mins PT General Charges $$ ACUTE PT VISIT: 1 Visit         Lacinda Fass, PT, DPT  01/07/2024, 11:39 AM

## 2024-01-07 NOTE — Plan of Care (Signed)
  Problem: Acute Rehab PT Goals(only PT should resolve) Goal: Pt Will Transfer Bed To Chair/Chair To Bed Outcome: Progressing Flowsheets (Taken 01/07/2024 1145) Pt will Transfer Bed to Chair/Chair to Bed: with supervision Goal: Pt Will Ambulate Outcome: Progressing Flowsheets (Taken 01/07/2024 1145) Pt will Ambulate:  15 feet  with supervision  with rolling walker Goal: Pt/caregiver will Perform Home Exercise Program Outcome: Progressing Flowsheets (Taken 01/07/2024 1145) Pt/caregiver will Perform Home Exercise Program:  For improved balance  Independently  For increased strengthening   Lacinda Fass, PT, DPT

## 2024-01-07 NOTE — Consult Note (Signed)
 ORTHOPAEDIC CONSULTATION  REQUESTING PHYSICIAN: Mcarthur Pick, MD  ASSESSMENT AND PLAN: 58 y.o. male with the following: Left leg pain  No orthopedic intervention is warranted at this time.  Continue to treat his nerve related symptoms.  Consider prednisone, gabapentin  and pain medications as needed.  If he has prolonged left foot drop, consider an AFO  Consider a full workup of his back to assess for nerve compression.  There were no acute findings on the CT scan of his lumbar spine.  - Weight Bearing Status/Activity: Weightbearing as tolerated.  - Additional recommended labs/tests: None  -VTE Prophylaxis: As needed  - Pain control: As needed  - Follow-up plan: As needed  -Procedures: None  Chief Complaint: Left leg pain  HPI: ZYQUAN CROTTY is a 59 y.o. male with acute kidney injury, who has been admitted.  Primary team is requesting evaluation due to pain in the left foot, as well as numbness and tingling, and some bruising of the left thigh.  Approximately week ago, he states that he fell out of bed.  It was approximately 3 feet to a carpeted floor.  He is not sure how he landed.  He did have some pain at the time.  He was asleep when he fell.  He was able to get himself back into bed.  The pain progressively worsened.  In addition, because he was unable to ambulate freely, he did not eat or drink consistently.  He lives with his mother, he has some dementia, and cannot provide consistent.  He was subsequently admitted to treatment of his AKI.  He complained of pain over the lateral thigh, as well as a numbness and tingling distal to the left knee.  This has not changed.  He is taking medications on a regular basis.  Pain is not getting worse.  Past Medical History:  Diagnosis Date   Anxiety    Arthritis    Back abrasion    Depression    GERD (gastroesophageal reflux disease)    Mass    History reviewed. No pertinent surgical history. Social History    Socioeconomic History   Marital status: Single    Spouse name: Not on file   Number of children: Not on file   Years of education: Not on file   Highest education level: Not on file  Occupational History   Not on file  Tobacco Use   Smoking status: Every Day    Current packs/day: 1.00    Average packs/day: 1 pack/day for 38.0 years (38.0 ttl pk-yrs)    Types: Cigarettes   Smokeless tobacco: Former  Substance and Sexual Activity   Alcohol use: No    Alcohol/week: 0.0 standard drinks of alcohol   Drug use: Yes    Types: Cocaine   Sexual activity: Not on file  Other Topics Concern   Not on file  Social History Narrative   Not on file   Social Drivers of Health   Financial Resource Strain: Not on file  Food Insecurity: No Food Insecurity (01/06/2024)   Hunger Vital Sign    Worried About Running Out of Food in the Last Year: Never true    Ran Out of Food in the Last Year: Never true  Transportation Needs: No Transportation Needs (01/06/2024)   PRAPARE - Administrator, Civil Service (Medical): No    Lack of Transportation (Non-Medical): No  Physical Activity: Not on file  Stress: Not on file  Social Connections: Unknown (01/06/2024)  Social Connection and Isolation Panel    Frequency of Communication with Friends and Family: Three times a week    Frequency of Social Gatherings with Friends and Family: Three times a week    Attends Religious Services: Never    Active Member of Clubs or Organizations: No    Attends Engineer, structural: Never    Marital Status: Patient declined   Family History  Problem Relation Age of Onset   Heart disease Father    Mental illness Father    Cancer Mother        breast   Mental illness Sister    Cancer Maternal Uncle    Diabetes Maternal Grandmother    Diabetes Maternal Grandfather    Diabetes Paternal Grandmother    Diabetes Paternal Grandfather    Allergies  Allergen Reactions   Penicillins Nausea And  Vomiting   Gramineae Pollens Other (See Comments)    Hey fever, red eye   Pollen Extract Other (See Comments)    Congestion    Prior to Admission medications   Medication Sig Start Date End Date Taking? Authorizing Provider  cholecalciferol  (VITAMIN D ) 1000 units tablet Take 1,000 Units by mouth daily.   Yes [provider]  methadone (DOLOPHINE) 10 MG/5ML solution Take 40 mg by mouth daily. 11/08/15  Yes [provider]   US  RENAL Result Date: 01/06/2024 CLINICAL DATA:  [id number] AKI (acute kidney injury) (HCC) [id number] EXAM: RENAL / URINARY TRACT ULTRASOUND COMPLETE COMPARISON:  February 25, 2017 FINDINGS: Right Kidney: Renal measurements: 13.2 x 6 x 6.2 cm = volume: 258 mL.Normal echogenicity. No mass. No hydronephrosis or nephrolithiasis. Left Kidney: Renal measurements: 13.5 x 6.4 x 6.2 cm = volume: 289 mL. Normal echogenicity. No mass. No hydronephrosis or nephrolithiasis. Bladder: Appears normal for degree of bladder distention. Other: None. IMPRESSION: No hydronephrosis or nephrolithiasis. Electronically Signed   By: Rogelia Myers M.D.   On: 01/06/2024 10:20   US  Venous Img Lower Unilateral Left Result Date: 01/05/2024 CLINICAL DATA:  58 year old male with history of left lower extremity pain and swelling. EXAM: LEFT LOWER EXTREMITY VENOUS DOPPLER ULTRASOUND TECHNIQUE: Gray-scale sonography with graded compression, as well as color Doppler and duplex ultrasound were performed to evaluate the left lower extremity deep venous systems from the level of the common femoral vein and including the common femoral, femoral, profunda femoral, popliteal and calf veins including the posterior tibial, peroneal and gastrocnemius veins when visible. Spectral Doppler was utilized to evaluate flow at rest and with distal augmentation maneuvers in the common femoral, femoral and popliteal veins. The contralateral common femoral vein was also evaluated for comparison. COMPARISON:  None Available.  FINDINGS: LEFT LOWER EXTREMITY Common Femoral Vein: No evidence of thrombus. Normal compressibility, respiratory phasicity and response to augmentation. Central Greater Saphenous Vein: No evidence of thrombus. Normal compressibility and flow on color Doppler imaging. Central Profunda Femoral Vein: No evidence of thrombus. Normal compressibility and flow on color Doppler imaging. Femoral Vein: No evidence of thrombus. Normal compressibility, respiratory phasicity and response to augmentation. Popliteal Vein: No evidence of thrombus. Normal compressibility, respiratory phasicity and response to augmentation. Calf Veins: No evidence of thrombus. Normal compressibility and flow on color Doppler imaging. Other Findings:  None. RIGHT LOWER EXTREMITY Common Femoral Vein: No evidence of thrombus. Normal compressibility, respiratory phasicity and response to augmentation. IMPRESSION: No evidence of left lower extremity deep venous thrombosis. Ester Sides, MD [facility] Electronically Signed   By: Ester Sides HERO.D.  On: 01/05/2024 14:55   Family History Reviewed and non-contributory, no pertinent history of problems with bleeding or anesthesia    Review of Systems No fevers or chills No numbness or tingling No chest pain No shortness of breath No bowel or bladder dysfunction No GI distress No headaches    OBJECTIVE  Vitals:Patient Vitals for the past 8 hrs:  BP Temp Temp src Pulse  01/07/24 1258 119/73 97.9 F (36.6 C) Oral 71   General: Alert, no acute distress Cardiovascular: Warm extremities noted Respiratory: No cyanosis, no use of accessory musculature GI: No organomegaly, abdomen is soft and non-tender Skin: No lesions in the area of chief complaint other than those listed below in MSK exam.  Neurologic: Sensation intact distally save for the below mentioned MSK exam Psychiatric: Patient is competent for consent with normal mood  and affect Lymphatic: No swelling obvious and reported other than the area involved in the exam below Extremities   LLE: Demonstrates some mild diffuse swelling.  No redness.  Thigh compartments are soft and compressible.  Left buttock is soft and compressible.  Well-perfused.  He is unable to actively dorsiflex the left ankle and great toe.  He has decreased sensation from just below the level of his knee distally.  Prior to this event, he was able to fully dorsiflex the left ankle, and was able to use both hands to assist in relation.    Test Results Imaging Left hip x-ray and CT scan are negative for acute injury.  Minimal degenerative changes.  No signs of an occult fracture.  No additional injuries  Labs cbc Recent Labs    01/05/24 1206 01/06/24 0340  WBC 9.3 8.8  HGB 12.6* 10.3*  HCT 35.6* 28.6*  PLT 238 234    Recent Labs    01/06/24 1113 01/07/24 0341  NA 130* 129*  K 3.2* 3.5  CL 94* 95*  CO2 17* 20*  GLUCOSE 142* 104*  BUN 93* 91*  CREATININE 8.06* 7.67*  CALCIUM  7.0* 7.2*

## 2024-01-07 NOTE — NC FL2 (Signed)
 [facility] [facility]  MEDICAID FL2 LEVEL OF CARE FORM     IDENTIFICATION  Patient Name: Gene Smith Birthdate: 1965/05/16 Sex: male Admission Date (Current Location): 01/05/2024  [facility] [place] and [place] Number:  [employer] and Address:  [facility],  [address] [place] [address]      Provider Number: [id number]  Attending Physician Name and Address:  Mcarthur Pick, MD  Relative Name and Phone Number:       Current Level of Care: Hospital Recommended Level of Care: Skilled Nursing Facility Prior Approval Number:    Date Approved/Denied:   PASRR Number:    Discharge Plan: SNF    Current Diagnoses: Patient Active Problem List   Diagnosis Date Noted   AKI (acute kidney injury) (HCC) 01/05/2024   Rhabdomyolysis 01/05/2024   Other chronic pain 05/13/2016   History of narcotic addiction (HCC) 05/13/2016   Chronic obstructive pulmonary disease (HCC) 02/07/2016   Hyperlipidemia 11/07/2015   Nicotine dependence, cigarettes, with other nicotine-induced disorders 01/23/2015   Mass of chest wall, left     Orientation RESPIRATION BLADDER Height & Weight     Self, Time, Situation, Place  Normal Continent Weight: 187 lb (84.8 kg) Height:  5' 10 (177.8 cm)  BEHAVIORAL SYMPTOMS/MOOD NEUROLOGICAL BOWEL NUTRITION STATUS      Continent Diet (regular)  AMBULATORY STATUS COMMUNICATION OF NEEDS Skin   Extensive Assist Verbally Normal                       Personal Care Assistance Level of Assistance  Bathing, Feeding, Dressing Bathing Assistance: Limited assistance Feeding assistance: Independent Dressing Assistance: Limited assistance     Functional Limitations Info  Sight, Hearing, Speech Sight Info: Impaired Hearing Info: Adequate Speech Info: Adequate    SPECIAL CARE FACTORS FREQUENCY  PT (By licensed PT), OT (By licensed OT)     PT Frequency: 5 x a week OT Frequency: 5 x a week            Contractures Contractures Info: Not present     Additional Factors Info  Code Status, Allergies Code Status Info: FULL Allergies Info: Penicillins, Gramineae Pollens, and Pollen Extract           Current Medications (01/07/2024):  This is the current hospital active medication list Current Facility-Administered Medications  Medication Dose Route Frequency Provider Last Rate Last Admin   0.9 %  sodium chloride  infusion   Intravenous Continuous Sigdel, Santosh, MD 75 mL/hr at 01/07/24 0515 New Bag at 01/07/24 0515   acetaminophen  (TYLENOL ) tablet 650 mg  650 mg Oral Q6H PRN Emokpae, Ejiroghene E, MD   650 mg at 01/06/24 1936   Or   acetaminophen  (TYLENOL ) suppository 650 mg  650 mg Rectal Q6H PRN Emokpae, Ejiroghene E, MD       Chlorhexidine  Gluconate Cloth 2 % PADS 6 each  6 each Topical Daily Emokpae, Ejiroghene E, MD   6 each at 01/06/24 1048   heparin  injection 5,000 Units  5,000 Units Subcutaneous Q8H Emokpae, Ejiroghene E, MD       influenza vac split trivalent PF (FLUZONE ) injection 0.5 mL  0.5 mL Intramuscular Tomorrow-1000 Sigdel, Santosh, MD       ondansetron  (ZOFRAN ) tablet 4 mg  4 mg Oral Q6H PRN Emokpae, Ejiroghene E, MD       Or   ondansetron  (ZOFRAN ) injection 4 mg  4 mg Intravenous Q6H PRN Emokpae, Ejiroghene E, MD   4 mg at 01/07/24 [phone]  oxyCODONE -acetaminophen  (PERCOCET/ROXICET) 5-325 MG per tablet 1 tablet  1 tablet Oral Q4H PRN Sigdel, Santosh, MD   1 tablet at 01/07/24 1214   polyethylene glycol (MIRALAX  / GLYCOLAX ) packet 17 g  17 g Oral Daily PRN Emokpae, Ejiroghene E, MD       sodium bicarbonate  150 mEq in dextrose  5 % 1,150 mL infusion   Intravenous Continuous Triplett, Tammy, PA-C 75 mL/hr at 01/07/24 0517 New Bag at 01/07/24 0517     Discharge Medications: Please see discharge summary for a list of discharge medications.  Relevant Imaging Results:  Relevant Lab Results:   Additional Information SSN: [id number]  Noreen KATHEE [place], [place]

## 2024-01-07 NOTE — Progress Notes (Signed)
 PROGRESS NOTE    Gene Smith  FMW:[id number] DOB: [birth year] DOA: 01/05/2024 PCP: Benjamin Raina Elizabeth, NP   Brief Narrative:    Assessment & Plan:   Principal Problem:   AKI (acute kidney injury) (HCC) Active Problems:   Rhabdomyolysis   Nicotine dependence, cigarettes, with other nicotine-induced disorders   Chronic obstructive pulmonary disease (HCC)   History of narcotic addiction (HCC)   58 year old male with history of COPD, history of narcotic addiction in the past comes in with fall and pain in the left lower extremity, found to have AKI with creatinine of about 8, rhabdomyolysis with CK around 27k.  Assessment and Plan: * AKI (acute kidney injury) (HCC) Creatinine markedly elevated at 7.67.  Is static despite IV fluids. no known history of kidney disease.  Creatinine last checked 7 years ago was normal at 0.6.  AKI likely secondary to severe rhabdomyolysis, and dehydration.  He is not on any medications.  UA with large hemoglobin.  - Patient received total of 3.5 L bolus on admission and is continued on bicarb drip as well as NS 75 cc/h.  Creatinine remains same at 7.5.  Patient is making urine.  He does not feel short of breath or has any evidence of pulmonary edema.  Has hypokalemia which will be repleted today. Will repeat BMP later today as well as CK level. Currently has no acute indication for dialysis. Renal ultrasound is negative for hydronephrosis or obstruction  Rhabdomyolysis Severe rhabdomyolysis with CK of 27,024. - Hydrate per above with sodium bicarb and normal saline -Follow-up CK today   Anion gap metabolic acidosis-serum bicarb of 17, anion gap of 19.  Likely due to azotemia from AKI.  Vomiting likely from azotemia.  Will continue IV fluids.  Currently is on IV bicarbonate as well as IV LR.   Fall- off bed onto left hip with persistent left hip pain, on exam there is bruising to the left lateral hip area.  CT shows mild subcutaneous edema  anterolaterally in the proximal thigh.  No organized fluid collection, foreign body or soft tissue emphysema.   Left lower extremity pain: Patient reports of some numbness and pain left lower extremity.  He has some edema, Doppler is negative for DVT.  Clinically has no cellulitis.  Has some bruise in the left upper thigh.  Numbness could be secondary to fall and pain.   Extremity is neurovascularly intact.  Suspicion of compartment syndrome is low.  I have asked orthopedics to evaluate. Continue Norco   Chronic obstructive pulmonary disease (HCC) Stable.     DVT prophylaxis: Heparin  Code Status: Full code Family Communication: None at bedside Disposition Plan: > 2 days Consults called: None Admission status: Inpt tele I certify that at the point of admission it is my clinical judgment that the patient will require inpatient hospital care spanning beyond 2 midnights from the point of admission due to high intensity of service, high risk for further deterioration and high frequency of surveillance required.       Remains inpatient appropriate because: Renal failure requiring daily labs monitoring as well as IV infusion of fluids and close monitoring   Subjective: Patient seen and examined at the bedside.  Patient continues to complain of numbness of left lower extremity, also complains of pain.  Vital signs are stable.  Pulses intact.  He says Norco helps but does not last long.  Has been having good urine output.  Creatinine is static.  Objective: Vitals:   01/06/24 1935 01/06/24  2001 01/07/24 0030 01/07/24 0357  BP:  107/80 (!) 151/82 134/71  Pulse:  (!) 52 68 63  Resp: 18 (!) 23 19   Temp:  98.7 F (37.1 C)  98.4 F (36.9 C)  TempSrc:  Oral  Oral  SpO2:  91% 100% 100%  Weight:      Height:        Intake/Output Summary (Last 24 hours) at 01/07/2024 1143 Last data filed at 01/07/2024 0357 Gross per 24 hour  Intake 2208.65 ml  Output 1500 ml  Net 708.65 ml   Filed  Weights   01/05/24 1025 01/06/24 1017  Weight: 86.2 kg 84.8 kg    Examination:  General exam: Appears calm and comfortable  Respiratory system: Bilateral decreased breath sounds at bases Cardiovascular system: S1 & S2 heard, Rate controlled Gastrointestinal system: Abdomen is nondistended, soft and nontender. Normal bowel sounds heard. Extremities: Left lower extremity edema up to upper thigh, small bruise noted on the lateral thigh, nontender to palpation. Central nervous system: Alert and oriented. No focal neurological deficits. Moving extremities Skin: No rashes, lesions or ulcers Psychiatry: Judgement and insight appear normal. Mood & affect appropriate.     Data Reviewed: I have personally reviewed following labs and imaging studies  CBC: Recent Labs  Lab 01/05/24 1206 01/06/24 0340  WBC 9.3 8.8  NEUTROABS 7.4  --   HGB 12.6* 10.3*  HCT 35.6* 28.6*  MCV 83.4 81.9  PLT 238 234   Basic Metabolic Panel: Recent Labs  Lab 01/05/24 1206 01/05/24 2033 01/06/24 0340 01/06/24 1113 01/07/24 0341  NA 126* 127* 127* 130* 129*  K 3.9 3.4* 3.4* 3.2* 3.5  CL 90* 93* 94* 94* 95*  CO2 17* 18* 20* 17* 20*  GLUCOSE 108* 163* 111* 142* 104*  BUN 88* 86* 92* 93* 91*  CREATININE 7.67* 7.50* 7.44* 8.06* 7.67*  CALCIUM  7.4* 6.9* 6.8* 7.0* 7.2*   GFR: Estimated Creatinine Clearance: 10.6 mL/min (A) (by C-G formula based on SCr of 7.67 mg/dL (H)). Liver Function Tests: No results for input(s): AST, ALT, ALKPHOS, BILITOT, PROT, ALBUMIN in the last 168 hours. No results for input(s): LIPASE, AMYLASE in the last 168 hours. No results for input(s): AMMONIA in the last 168 hours. Coagulation Profile: No results for input(s): INR, PROTIME in the last 168 hours. Cardiac Enzymes: Recent Labs  Lab 01/05/24 1206 01/06/24 1113  CKTOTAL 27,024* 11,919*   BNP (last 3 results) No results for input(s): PROBNP in the last 8760 hours. HbA1C: No results for  input(s): HGBA1C in the last 72 hours. CBG: No results for input(s): GLUCAP in the last 168 hours. Lipid Profile: No results for input(s): CHOL, HDL, LDLCALC, TRIG, CHOLHDL, LDLDIRECT in the last 72 hours. Thyroid Function Tests: No results for input(s): TSH, T4TOTAL, FREET4, T3FREE, THYROIDAB in the last 72 hours. Anemia Panel: No results for input(s): VITAMINB12, FOLATE, FERRITIN, TIBC, IRON, RETICCTPCT in the last 72 hours. Sepsis Labs: No results for input(s): PROCALCITON, LATICACIDVEN in the last 168 hours.  No results found for this or any previous visit (from the past 240 hours).       Radiology Studies: US  RENAL Result Date: 01/06/2024 CLINICAL DATA:  [id number] AKI (acute kidney injury) (HCC) [id number] EXAM: RENAL / URINARY TRACT ULTRASOUND COMPLETE COMPARISON:  February 25, 2017 FINDINGS: Right Kidney: Renal measurements: 13.2 x 6 x 6.2 cm = volume: 258 mL.Normal echogenicity. No mass. No hydronephrosis or nephrolithiasis. Left Kidney: Renal measurements: 13.5 x 6.4 x 6.2 cm = volume: 289 mL. Normal  echogenicity. No mass. No hydronephrosis or nephrolithiasis. Bladder: Appears normal for degree of bladder distention. Other: None. IMPRESSION: No hydronephrosis or nephrolithiasis. Electronically Signed   By: Rogelia Myers M.D.   On: 01/06/2024 10:20   US  Venous Img Lower Unilateral Left Result Date: 01/05/2024 CLINICAL DATA:  58 year old male with history of left lower extremity pain and swelling. EXAM: LEFT LOWER EXTREMITY VENOUS DOPPLER ULTRASOUND TECHNIQUE: Gray-scale sonography with graded compression, as well as color Doppler and duplex ultrasound were performed to evaluate the left lower extremity deep venous systems from the level of the common femoral vein and including the common femoral, femoral, profunda femoral, popliteal and calf veins including the posterior tibial, peroneal and gastrocnemius veins when visible. Spectral Doppler was  utilized to evaluate flow at rest and with distal augmentation maneuvers in the common femoral, femoral and popliteal veins. The contralateral common femoral vein was also evaluated for comparison. COMPARISON:  None Available. FINDINGS: LEFT LOWER EXTREMITY Common Femoral Vein: No evidence of thrombus. Normal compressibility, respiratory phasicity and response to augmentation. Central Greater Saphenous Vein: No evidence of thrombus. Normal compressibility and flow on color Doppler imaging. Central Profunda Femoral Vein: No evidence of thrombus. Normal compressibility and flow on color Doppler imaging. Femoral Vein: No evidence of thrombus. Normal compressibility, respiratory phasicity and response to augmentation. Popliteal Vein: No evidence of thrombus. Normal compressibility, respiratory phasicity and response to augmentation. Calf Veins: No evidence of thrombus. Normal compressibility and flow on color Doppler imaging. Other Findings:  None. RIGHT LOWER EXTREMITY Common Femoral Vein: No evidence of thrombus. Normal compressibility, respiratory phasicity and response to augmentation. IMPRESSION: No evidence of left lower extremity deep venous thrombosis. Ester Sides, MD [facility] Radiology Electronically Signed   By: Ester Sides M.D.   On: 01/05/2024 14:55   CT Hip Left Wo Contrast Result Date: 01/05/2024 CLINICAL DATA:  Low back and left hip pain after falling. EXAM: CT OF THE LEFT HIP WITHOUT CONTRAST TECHNIQUE: Multidetector CT imaging of the left hip was performed according to the standard protocol. Multiplanar CT image reconstructions were also generated. RADIATION DOSE REDUCTION: This exam was performed according to the departmental dose-optimization program which includes automated exposure control, adjustment of the mA and/or kV according to patient size and/or use of iterative reconstruction technique. COMPARISON:  Same day radiographs. FINDINGS:  Bones/Joint/Cartilage No evidence of acute fracture, dislocation or femoral head osteonecrosis. No significant left hip arthropathy or effusion. The visualized left sacroiliac joint appears unremarkable. Ligaments Suboptimally assessed by CT. Muscles and Tendons No focal muscular abnormalities are identified by CT. Soft tissues Mild subcutaneous edema anterolaterally in the proximal thigh. No organized fluid collection, foreign body or soft tissue emphysema demonstrated. IMPRESSION: 1. No evidence of acute left hip fracture or dislocation. 2. Mild subcutaneous edema anterolaterally in the proximal thigh. No organized fluid collection, foreign body or soft tissue emphysema demonstrated. Electronically Signed   By: Elsie Perone M.D.   On: 01/05/2024 12:20   CT Lumbar Spine Wo Contrast Result Date: 01/05/2024 CLINICAL DATA:  Back trauma, no prior imaging (Age >= 16y) fall, low back and hip pain Status post fall with low back and left hip pain. EXAM: CT LUMBAR SPINE WITHOUT CONTRAST TECHNIQUE: Multidetector CT imaging of the lumbar spine was performed without intravenous contrast administration. Multiplanar CT image reconstructions were also generated. RADIATION DOSE REDUCTION: This exam was performed according to the departmental dose-optimization program which includes automated exposure control, adjustment of the mA and/or kV according to patient size  and/or use of iterative reconstruction technique. COMPARISON:  Chest CT 12/26/2014, lumbar spine radiographs 11/13/2017 and lumbar MRI 02/08/2018. 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 This numbering is concordant with prior imaging. Alignment: Normal. Vertebrae: No evidence of acute fracture or traumatic subluxation. No evidence of pars defect or aggressive osseous lesion. Segmented right L1 transverse process. Paraspinal and other soft tissues: No acute paraspinal findings. Mild aortic and iliac  atherosclerosis. Dependent subcutaneous edema in the mid back. Disc levels: No acute disc space findings are identified. There is stable mild loss of disc height with mild disc bulging at L3-4. Moderate facet degenerative changes are present bilaterally at L5-S1. No significant disc herniation or high-grade foraminal narrowing demonstrated. IMPRESSION: 1. No evidence of acute lumbar spine fracture, traumatic subluxation or static signs of instability. 2. Stable mild lumbar spondylosis and moderate facet degenerative changes at L5-S1. 3.  Aortic Atherosclerosis (ICD10-I70.0). Electronically Signed   By: Elsie Perone M.D.   On: 01/05/2024 12:16        Scheduled Meds:  Chlorhexidine  Gluconate Cloth  6 each Topical Daily   heparin   5,000 Units Subcutaneous Q8H   influenza vac split trivalent PF  0.5 mL Intramuscular Tomorrow-1000   Continuous Infusions:  sodium chloride  75 mL/hr at 01/07/24 0515   sodium bicarbonate  150 mEq in dextrose  5 % 1,150 mL infusion 75 mL/hr at 01/07/24 0517          Derryl Duval, MD [facility] 01/07/2024, 11:43 AM

## 2024-01-07 NOTE — Consult Note (Signed)
 Reason for Consult: AKI Referring Physician:  [place], MD  LUKKA BLACK is an 58 y.o. male.  HPI: Mr. Vandervelden has a PMH significant for tobacco abuse, COPD, depression, GERD, and DJD who presented to [facility] ED via EMS on 01/05/24 complaining of 5 day history of left hip pain.  He apparently fell out of bed on 12/31/23 and hit the floor onto his left hip.  He had severe pain radiating down his leg and was unable to ambulate and has been lying in bed.  He admits to smoking crack cocaine for pain relief.  Denies any NSAID use.  He noted decreased po intake and also developed N/V/D.  In the ED, temp 98.3, Bp 120/75, HR 84, SpO2 93%.  Labs were notable for WBC 9.3, Hgb 12.6, Na 126, Cl 90, Co2 17, BUN 88, Cr 7.67, Ca 7.4, CK 27,024, UA with large blood but few RBC's.  CT of hip without evidence of hip fracture or dislocation.  CT of LS spine without fracture or traumatic subluxation.  He was admitted with AKI due to rhabdomyolysis and started on IVF's.  His Scr peaked at 8.06 yesterday and CK levels have improved to 11,919.  We were consulted to further evaluate his AKI.  He was started on isotonic bicarb at time of admission.  There is a gap of 9 months in lab values, see the trend below.  Trend in Creatinine:  Creatinine, Ser  Date/Time Value Ref Range Status  01/07/2024 03:41 AM 7.67 (H) 0.61 - 1.24 mg/dL Final  90/82/7974 88:86 AM 8.06 (H) 0.61 - 1.24 mg/dL Final  90/82/7974 96:59 AM 7.44 (H) 0.61 - 1.24 mg/dL Final  90/83/7974 91:66 PM 7.50 (H) 0.61 - 1.24 mg/dL Final  90/83/7974 87:93 PM 7.67 (H) 0.61 - 1.24 mg/dL Final  87/88/7975 9.38 (L)    08/05/2016 10:50 AM 0.68 (L) 0.70 - 1.33 mg/dL Final  98/84/7981 92:63 AM 0.77 0.70 - 1.33 mg/dL Final  89/80/7982 92:92 AM 0.65 (L) 0.70 - 1.33 mg/dL Final  90/93/7983 92:59 AM 0.59 (L) 0.61 - 1.24 mg/dL Final  90/81/7984 93:61 PM 0.85 0.50 - 1.35 mg/dL Final  90/81/7984 87:50 AM 0.71 0.50 - 1.35 mg/dL Final  89/84/7989 92:83 PM 0.82 0.4 - 1.5 mg/dL Final     PMH:   Past Medical History:  Diagnosis Date   Anxiety    Arthritis    Back abrasion    Depression    GERD (gastroesophageal reflux disease)    Mass     PSH:  History reviewed. No pertinent surgical history.  Allergies:  Allergies  Allergen Reactions   Penicillins Nausea And Vomiting   Gramineae Pollens Other (See Comments)    Hey fever, red eye   Pollen Extract Other (See Comments)    Congestion     Medications:   Prior to Admission medications   Medication Sig Start Date End Date Taking? Authorizing Provider  cholecalciferol  (VITAMIN D ) 1000 units tablet Take 1,000 Units by mouth daily.   Yes [provider]  methadone (DOLOPHINE) 10 MG/5ML solution Take 40 mg by mouth daily. 11/08/15  Yes [provider]    Inpatient medications:  Chlorhexidine  Gluconate Cloth  6 each Topical Daily   heparin   5,000 Units Subcutaneous Q8H   influenza vac split trivalent PF  0.5 mL Intramuscular Tomorrow-1000    Discontinued Meds:   Medications Discontinued During This Encounter  Medication Reason   METHADONE HCL PO Dose change   atorvastatin  (LIPITOR) 20 MG tablet Dose change  tamsulosin (FLOMAX) 0.4 MG CAPS capsule Patient Preference   atorvastatin  (LIPITOR) 40 MG tablet Patient Preference   traZODone (DESYREL) 50 MG tablet Patient Preference   PARoxetine (PAXIL) 40 MG tablet Patient Preference   gabapentin  (NEURONTIN ) 300 MG capsule Patient Preference   mometasone -formoterol  (DULERA) 100-5 MCG/ACT AERO Patient Preference   albuterol  (PROVENTIL  HFA;VENTOLIN  HFA) 108 (90 Base) MCG/ACT inhaler Patient Preference   hydrOXYzine (ATARAX) 25 MG tablet Patient Preference   ibuprofen (ADVIL,MOTRIN) 200 MG tablet Patient Preference   aspirin 81 MG chewable tablet Patient Preference   0.9 %  sodium chloride  infusion    0.9 %  sodium chloride  infusion    oxyCODONE -acetaminophen  (PERCOCET/ROXICET) 5-325 MG per tablet 1 tablet     Social History:  reports that he  has been smoking cigarettes. He has a 38 pack-year smoking history. He has quit using smokeless tobacco. He reports current drug use. Drug: Cocaine. He reports that he does not drink alcohol.  Family History:   Family History  Problem Relation Age of Onset   Heart disease Father    Mental illness Father    Cancer Mother        breast   Mental illness Sister    Cancer Maternal Uncle    Diabetes Maternal Grandmother    Diabetes Maternal Grandfather    Diabetes Paternal Grandmother    Diabetes Paternal Grandfather     Pertinent items are noted in HPI. Weight change: -1.361 kg  Intake/Output Summary (Last 24 hours) at 01/07/2024 1145 Last data filed at 01/07/2024 0357 Gross per 24 hour  Intake 2208.65 ml  Output 1500 ml  Net 708.65 ml   BP 134/71 (BP Location: Left Arm)   Pulse 63   Temp 98.4 F (36.9 C) (Oral)   Resp 19   Ht 5' 10 (1.778 m)   Wt 84.8 kg   SpO2 100%   BMI 26.83 kg/m  Vitals:   01/06/24 1935 01/06/24 2001 01/07/24 0030 01/07/24 0357  BP:  107/80 (!) 151/82 134/71  Pulse:  (!) 52 68 63  Resp: 18 (!) 23 19   Temp:  98.7 F (37.1 C)  98.4 F (36.9 C)  TempSrc:  Oral  Oral  SpO2:  91% 100% 100%  Weight:      Height:         General appearance: alert, cooperative, no distress, and lying in bed eating lunch Head: Normocephalic, without obvious abnormality, atraumatic Resp: clear to auscultation bilaterally Cardio: regular rate and rhythm, S1, S2 normal, no murmur, click, rub or gallop GI: soft, non-tender; bowel sounds normal; no masses,  no organomegaly Extremities: edema 1+ edema of left leg  Labs: Basic Metabolic Panel: Recent Labs  Lab 01/05/24 1206 01/05/24 2033 01/06/24 0340 01/06/24 1113 01/07/24 0341  NA 126* 127* 127* 130* 129*  K 3.9 3.4* 3.4* 3.2* 3.5  CL 90* 93* 94* 94* 95*  CO2 17* 18* 20* 17* 20*  GLUCOSE 108* 163* 111* 142* 104*  BUN 88* 86* 92* 93* 91*  CREATININE 7.67* 7.50* 7.44* 8.06* 7.67*  CALCIUM  7.4* 6.9* 6.8* 7.0*  7.2*   Liver Function Tests: No results for input(s): AST, ALT, ALKPHOS, BILITOT, PROT, ALBUMIN in the last 168 hours. No results for input(s): LIPASE, AMYLASE in the last 168 hours. No results for input(s): AMMONIA in the last 168 hours. CBC: Recent Labs  Lab 01/05/24 1206 01/06/24 0340  WBC 9.3 8.8  NEUTROABS 7.4  --   HGB 12.6* 10.3*  HCT 35.6* 28.6*  MCV  83.4 81.9  PLT 238 234   PT/INR: @LABRCNTIP (inr:5) Cardiac Enzymes: ) Recent Labs  Lab 01/05/24 1206 01/06/24 1113  CKTOTAL 27,024* 11,919*   CBG: No results for input(s): GLUCAP in the last 168 hours.  Iron Studies: No results for input(s): IRON, TIBC, TRANSFERRIN, FERRITIN in the last 168 hours.  Xrays/Other Studies: US  RENAL Result Date: 01/06/2024 CLINICAL DATA:  [id number] AKI (acute kidney injury) (HCC) [id number] EXAM: RENAL / URINARY TRACT ULTRASOUND COMPLETE COMPARISON:  February 25, 2017 FINDINGS: Right Kidney: Renal measurements: 13.2 x 6 x 6.2 cm = volume: 258 mL.Normal echogenicity. No mass. No hydronephrosis or nephrolithiasis. Left Kidney: Renal measurements: 13.5 x 6.4 x 6.2 cm = volume: 289 mL. Normal echogenicity. No mass. No hydronephrosis or nephrolithiasis. Bladder: Appears normal for degree of bladder distention. Other: None. IMPRESSION: No hydronephrosis or nephrolithiasis. Electronically Signed   By: Rogelia Myers M.D.   On: 01/06/2024 10:20   US  Venous Img Lower Unilateral Left Result Date: 01/05/2024 CLINICAL DATA:  58 year old male with history of left lower extremity pain and swelling. EXAM: LEFT LOWER EXTREMITY VENOUS DOPPLER ULTRASOUND TECHNIQUE: Gray-scale sonography with graded compression, as well as color Doppler and duplex ultrasound were performed to evaluate the left lower extremity deep venous systems from the level of the common femoral vein and including the common femoral, femoral, profunda femoral, popliteal and calf veins including the posterior tibial, peroneal  and gastrocnemius veins when visible. Spectral Doppler was utilized to evaluate flow at rest and with distal augmentation maneuvers in the common femoral, femoral and popliteal veins. The contralateral common femoral vein was also evaluated for comparison. COMPARISON:  None Available. FINDINGS: LEFT LOWER EXTREMITY Common Femoral Vein: No evidence of thrombus. Normal compressibility, respiratory phasicity and response to augmentation. Central Greater Saphenous Vein: No evidence of thrombus. Normal compressibility and flow on color Doppler imaging. Central Profunda Femoral Vein: No evidence of thrombus. Normal compressibility and flow on color Doppler imaging. Femoral Vein: No evidence of thrombus. Normal compressibility, respiratory phasicity and response to augmentation. Popliteal Vein: No evidence of thrombus. Normal compressibility, respiratory phasicity and response to augmentation. Calf Veins: No evidence of thrombus. Normal compressibility and flow on color Doppler imaging. Other Findings:  None. RIGHT LOWER EXTREMITY Common Femoral Vein: No evidence of thrombus. Normal compressibility, respiratory phasicity and response to augmentation. IMPRESSION: No evidence of left lower extremity deep venous thrombosis. Ester Sides, MD [facility] Electronically Signed   By: Ester Sides M.D.   On: 01/05/2024 14:55   CT Hip Left Wo Contrast Result Date: 01/05/2024 CLINICAL DATA:  Low back and left hip pain after falling. EXAM: CT OF THE LEFT HIP WITHOUT CONTRAST TECHNIQUE: Multidetector CT imaging of the left hip was performed according to the standard protocol. Multiplanar CT image reconstructions were also generated. RADIATION DOSE REDUCTION: This exam was performed according to the departmental dose-optimization program which includes automated exposure control, adjustment of the mA and/or kV according to patient size and/or use of iterative reconstruction  technique. COMPARISON:  Same day radiographs. FINDINGS: Bones/Joint/Cartilage No evidence of acute fracture, dislocation or femoral head osteonecrosis. No significant left hip arthropathy or effusion. The visualized left sacroiliac joint appears unremarkable. Ligaments Suboptimally assessed by CT. Muscles and Tendons No focal muscular abnormalities are identified by CT. Soft tissues Mild subcutaneous edema anterolaterally in the proximal thigh. No organized fluid collection, foreign body or soft tissue emphysema demonstrated. IMPRESSION: 1. No evidence of acute left hip fracture or dislocation. 2. Mild subcutaneous edema anterolaterally  in the proximal thigh. No organized fluid collection, foreign body or soft tissue emphysema demonstrated. Electronically Signed   By: Elsie Perone M.D.   On: 01/05/2024 12:20   CT Lumbar Spine Wo Contrast Result Date: 01/05/2024 CLINICAL DATA:  Back trauma, no prior imaging (Age >= 16y) fall, low back and hip pain Status post fall with low back and left hip pain. EXAM: CT LUMBAR SPINE WITHOUT CONTRAST TECHNIQUE: Multidetector CT imaging of the lumbar spine was performed without intravenous contrast administration. Multiplanar CT image reconstructions were also generated. RADIATION DOSE REDUCTION: This exam was performed according to the departmental dose-optimization program which includes automated exposure control, adjustment of the mA and/or kV according to patient size and/or use of iterative reconstruction technique. COMPARISON:  Chest CT 12/26/2014, lumbar spine radiographs 11/13/2017 and lumbar MRI 02/08/2018. 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 This numbering is concordant with prior imaging. Alignment: Normal. Vertebrae: No evidence of acute fracture or traumatic subluxation. No evidence of pars defect or aggressive osseous lesion. Segmented right L1 transverse process. Paraspinal and other soft tissues: No  acute paraspinal findings. Mild aortic and iliac atherosclerosis. Dependent subcutaneous edema in the mid back. Disc levels: No acute disc space findings are identified. There is stable mild loss of disc height with mild disc bulging at L3-4. Moderate facet degenerative changes are present bilaterally at L5-S1. No significant disc herniation or high-grade foraminal narrowing demonstrated. IMPRESSION: 1. No evidence of acute lumbar spine fracture, traumatic subluxation or static signs of instability. 2. Stable mild lumbar spondylosis and moderate facet degenerative changes at L5-S1. 3.  Aortic Atherosclerosis (ICD10-I70.0). Electronically Signed   By: Elsie Perone M.D.   On: 01/05/2024 12:16     Assessment/Plan:  AKI - due to rhabdomyolysis.  SCr is slowly improving with IVF's as well as UOP.  Would continue with isotonic bicarb IVF's and follow daily Scr, CK levels, and UOP.  No signs or symptoms of uremia.  No indication for dialysis at this time.   Avoid nephrotoxic medications including NSAIDs and iodinated intravenous contrast exposure unless the latter is absolutely indicated.   Preferred narcotic agents for pain control are hydromorphone, fentanyl , and methadone. Morphine should not be used.  Avoid Baclofen and avoid oral sodium phosphate and magnesium citrate based laxatives / bowel preps.  Continue strict Input and Output monitoring. Will monitor the patient closely with you and intervene or adjust therapy as indicated by changes in clinical status/labs  Rhabdomyolysis - s/p fall and admits to lying on floor but does not know for how long.  He did admit to crack cocaine use but denied any etoh or other illicit agents.  Continue with IVF's as above.  CK levels trending down. Left lower extremity pain and edema - due to rhabdo.  Per primary COPD - stable Hyponatremia - due to AKI and edema.  Continue with IVF's and follow. Anemia - normocytic.  Continue to follow and check iron  stores. Hypokalemia - replete and follow.  Crack cocaine use - he denied use before the fall but would explain his fall and lying on floor for an extended period of time.  Plan per primary svc.    Fairy A Nekisha Mcdiarmid 01/07/2024, 11:45 AM

## 2024-01-07 NOTE — Plan of Care (Signed)

## 2024-01-07 NOTE — TOC Initial Note (Addendum)
 Transition of Care [facility]) - Initial/Assessment Note    Patient Details  Name: Gene Smith MRN: [id number] Date of Birth: 24-Dec-1965  [facility]) CM/SW Contact:    Noreen KATHEE Pinal, LCSWA Phone Number: 01/07/2024, 1:53 PM  Clinical Narrative:                  CSW spoke with patient at bedside regarding PT recommendation for  SNF. CSW spoke with patient regarding his insurance and challenges with his insurance covering for SNF. CSW did inform patient that a referral will still be done like normal to double check. Patient does live with his mother who he takes care of and only has a cane. CSW reached out to [facility] with [facility] who stated that they do not accept. Donald with [facility] did share that she have a contract and would look at referral. Referral was sent , CSW will continue to follow.   Addendum 2:00 pm   Donald asked for a drug screen to be done on behalf of patient. CSW asked MD along with nurse's to see if this could get obtained.   Expected Discharge Plan: Skilled Nursing Facility Barriers to Discharge: Continued Medical Work up   Patient Goals and CMS Choice Patient states their goals for this hospitalization and ongoing recovery are:: go to facility for rehab if they accept his insurance CMS Medicare.gov Compare Post Acute Care list provided to:: Patient Choice offered to / list presented to : Patient      Expected Discharge Plan and Services     Post Acute Care Choice: Durable Medical Equipment Living arrangements for the past 2 months: Apartment                                      Prior Living Arrangements/Services Living arrangements for the past 2 months: Apartment Lives with:: Parents Patient language and need for interpreter reviewed:: Yes Do you feel safe going back to the place where you live?: Yes      Need for Family Participation in Patient Care: Yes (Comment) Care giver support system in place?: Yes (comment) Current home  services: DME Criminal Activity/Legal Involvement Pertinent to Current Situation/Hospitalization: No - Comment as needed  Activities of Daily Living   ADL Screening (condition at time of admission) Independently performs ADLs?: Yes (appropriate for developmental age) Is the patient deaf or have difficulty hearing?: No Does the patient have difficulty seeing, even when wearing glasses/contacts?: No Does the patient have difficulty concentrating, remembering, or making decisions?: No  Permission Sought/Granted      Share Information with NAME: Jaylun     Permission granted to share info w Relationship: Patient     Emotional Assessment Appearance:: Appears stated age Attitude/Demeanor/Rapport: Self-Confident, Engaged Affect (typically observed): Accepting, Appropriate Orientation: : Oriented to Self, Oriented to Place, Oriented to  Time, Oriented to Situation Alcohol / Substance Use: Tobacco Use Psych Involvement: No (comment)  Admission diagnosis:  AKI (acute kidney injury) (HCC) [N17.9] Traumatic rhabdomyolysis, initial encounter (HCC) [T79.6XXA] Patient Active Problem List   Diagnosis Date Noted   AKI (acute kidney injury) (HCC) 01/05/2024   Rhabdomyolysis 01/05/2024   Other chronic pain 05/13/2016   History of narcotic addiction (HCC) 05/13/2016   Chronic obstructive pulmonary disease (HCC) 02/07/2016   Hyperlipidemia 11/07/2015   Nicotine dependence, cigarettes, with other nicotine-induced disorders 01/23/2015   Mass of chest wall, left    PCP:  Nsumanganyi, Raina Elizabeth, NP Pharmacy:   [facility] - [place], [place] - [address] [place] [address] Phone: [phone] Fax: [phone]     Social Drivers of Health (SDOH) Social History: SDOH Screenings   Food Insecurity: No Food Insecurity (01/06/2024)  Housing: Low Risk  (01/06/2024)  Transportation Needs: No Transportation Needs (01/06/2024)  Utilities: Not At Risk  (01/06/2024)  Social Connections: Unknown (01/06/2024)  Tobacco Use: High Risk (01/06/2024)   SDOH Interventions:     Readmission Risk Interventions    01/07/2024    1:49 PM 01/07/2024   10:38 AM  Readmission Risk Prevention Plan  Transportation Screening Complete Complete  Home Care Screening Complete Complete  Medication Review (RN CM) Complete Complete

## 2024-01-08 ENCOUNTER — Inpatient Hospital Stay (HOSPITAL_COMMUNITY): Payer: MEDICAID

## 2024-01-08 LAB — BASIC METABOLIC PANEL WITH GFR
Anion gap: 15 (ref 5–15)
BUN: 91 mg/dL — ABNORMAL HIGH (ref 6–20)
CO2: 22 mmol/L (ref 22–32)
Calcium: 7.5 mg/dL — ABNORMAL LOW (ref 8.9–10.3)
Chloride: 95 mmol/L — ABNORMAL LOW (ref 98–111)
Creatinine, Ser: 7.6 mg/dL — ABNORMAL HIGH (ref 0.61–1.24)
GFR, Estimated: 8 mL/min — ABNORMAL LOW (ref >60)
Glucose, Bld: 128 mg/dL — ABNORMAL HIGH (ref 70–99)
Potassium: 3.7 mmol/L (ref 3.5–5.1)
Sodium: 132 mmol/L — ABNORMAL LOW (ref 135–145)

## 2024-01-08 LAB — HEPATIC FUNCTION PANEL
ALT: 126 U/L — ABNORMAL HIGH (ref 0–44)
AST: 96 U/L — ABNORMAL HIGH (ref 15–41)
Albumin: 2.5 g/dL — ABNORMAL LOW (ref 3.5–5.0)
Alkaline Phosphatase: 35 U/L — ABNORMAL LOW (ref 38–126)
Bilirubin, Direct: <0.1 mg/dL (ref 0.0–0.2)
Total Bilirubin: 0.4 mg/dL (ref 0.0–1.2)
Total Protein: 5.2 g/dL — ABNORMAL LOW (ref 6.5–8.1)

## 2024-01-08 LAB — CK: Total CK: 5164 U/L — ABNORMAL HIGH (ref 49–397)

## 2024-01-08 MED ORDER — GABAPENTIN 100 MG PO CAPS
100.0000 mg | ORAL_CAPSULE | Freq: Two times a day (BID) | ORAL | Status: DC
  Administered 2024-01-08 (×2): 100 mg via ORAL
  Filled 2024-01-08 (×2): qty 1

## 2024-01-08 MED ORDER — VITAMIN D 25 MCG (1000 UNIT) PO TABS
1000.0000 [IU] | ORAL_TABLET | Freq: Every day | ORAL | Status: DC
  Administered 2024-01-08: 1000 [IU] via ORAL
  Filled 2024-01-08: qty 1

## 2024-01-08 MED ORDER — SODIUM CHLORIDE 0.9 % IV SOLN
INTRAVENOUS | Status: DC
Start: 2024-01-08 — End: 2024-01-09

## 2024-01-08 NOTE — Plan of Care (Signed)
   Problem: Education: Goal: Knowledge of General Education information will improve Description Including pain rating scale, medication(s)/side effects and non-pharmacologic comfort measures Outcome: Progressing   Problem: Health Behavior/Discharge Planning: Goal: Ability to manage health-related needs will improve Outcome: Progressing

## 2024-01-08 NOTE — Progress Notes (Addendum)
 PROGRESS NOTE    Gene Smith  FMW:[id number] DOB: 1966-03-24 DOA: 01/05/2024 PCP: Benjamin Raina Elizabeth, NP   Brief Narrative:    Assessment & Plan:   Principal Problem:   AKI (acute kidney injury) (HCC) Active Problems:   Rhabdomyolysis   Nicotine dependence, cigarettes, with other nicotine-induced disorders   Chronic obstructive pulmonary disease (HCC)   History of narcotic addiction (HCC)   58 year old male with history of COPD, history of narcotic addiction in the past comes in with fall and pain in the left lower extremity, found to have AKI with creatinine of about 8, rhabdomyolysis with CK around 27k.  Assessment and Plan: * AKI (acute kidney injury) (HCC) Creatinine markedly elevated at 7.67 on admission, remains static despite IVF. Renal ultrasound is negative for hydronephrosis or nephrolithiasis. Rhabdo as below , may have some underlying CKD, previous Cr 1 several years ago. Having good UO Nephrology input appreciated, continue IVF  Rhabdomyolysis Severe rhabdomyolysis with CK of 27,024. - Hydrate per above with sodium bicarb and normal saline -Follow-up CK  5000 today   Anion gap metabolic acidosis-serum bicarb of 17, anion gap of 19.  Likely due to azotemia from AKI.  Vomiting likely from azotemia.  Will continue IV fluids.  Currently is on IV bicarbonate as well as IV LR.   Fall- off bed onto left hip with persistent left hip pain, on exam there is bruising to the left lateral hip area.  CT shows mild subcutaneous edema anterolaterally in the proximal thigh.  No organized fluid collection, foreign body or soft tissue emphysema.   Left lower extremity pain: Patient reports of some numbness and pain left lower extremity.  He has some edema, Doppler is negative for DVT.  Clinically has no cellulitis.  Has some bruise in the left upper thigh.   Pt is not able to dorsiflex or plantar flex. ROM at hip and knee intact, pulses intact. CT hip and CT L spine is  negative for acute findings Will obtain MRI Lspine wo contrast Add neurontin , continue norco PT eval-->SNF recommended Appreciate orthopedic input, may need AFO  Chronic obstructive pulmonary disease (HCC) Stable.     DVT prophylaxis: Heparin  Code Status: Full code Family Communication: None at bedside Disposition Plan: SNF Consults called: None Admission status: Inpt tele I certify that at the point of admission it is my clinical judgment that the patient will require inpatient hospital care spanning beyond 2 midnights from the point of admission due to high intensity of service, high risk for further deterioration and high frequency of surveillance required.       Remains inpatient appropriate because: Renal failure requiring daily labs monitoring as well as IV infusion of fluids and close monitoring   Subjective: Patient seen and examined at the bedside.  Patient states he would like to go home today if possible.  He says his insurance does not cover SNF.  He has been getting intermittent oxycodone  to help with pain in the left leg.  Numbness is still persistent.  Does have some foot drop on the left.  He has been having good urine output.  Creatinine plateaued 7.6. Objective: Vitals:   01/07/24 0357 01/07/24 1258 01/07/24 1954 01/08/24 0550  BP: 134/71 119/73 135/73 (!) 141/82  Pulse: 63 71 83 73  Resp:      Temp: 98.4 F (36.9 C) 97.9 F (36.6 C) 97.8 F (36.6 C) 98.6 F (37 C)  TempSrc: Oral Oral Oral Oral  SpO2: 100%  99% 100%  Weight:      Height:        Intake/Output Summary (Last 24 hours) at 01/08/2024 0934 Last data filed at 01/08/2024 0815 Gross per 24 hour  Intake 960 ml  Output 1800 ml  Net -840 ml   Filed Weights   01/05/24 1025 01/06/24 1017  Weight: 86.2 kg 84.8 kg    Examination:  General exam: Appears calm and comfortable  Respiratory system: Bilateral decreased breath sounds at bases Cardiovascular system: S1 & S2 heard, Rate  controlled Gastrointestinal system: Abdomen is nondistended, soft and nontender. Normal bowel sounds heard. Extremities: Left lower extremity edema up to upper thigh, small bruise noted on the lateral thigh, nontender to palpation.edema noted. Pt not able to dorsiflex. Central nervous system: Alert and oriented. No focal neurological deficits. Moving extremities Skin: No rashes, lesions or ulcers Psychiatry: Judgement and insight appear normal. Mood & affect appropriate.     Data Reviewed: I have personally reviewed following labs and imaging studies  CBC: Recent Labs  Lab 01/05/24 1206 01/06/24 0340  WBC 9.3 8.8  NEUTROABS 7.4  --   HGB 12.6* 10.3*  HCT 35.6* 28.6*  MCV 83.4 81.9  PLT 238 234   Basic Metabolic Panel: Recent Labs  Lab 01/05/24 2033 01/06/24 0340 01/06/24 1113 01/07/24 0341 01/08/24 0426  NA 127* 127* 130* 129* 132*  K 3.4* 3.4* 3.2* 3.5 3.7  CL 93* 94* 94* 95* 95*  CO2 18* 20* 17* 20* 22  GLUCOSE 163* 111* 142* 104* 128*  BUN 86* 92* 93* 91* 91*  CREATININE 7.50* 7.44* 8.06* 7.67* 7.60*  CALCIUM  6.9* 6.8* 7.0* 7.2* 7.5*   GFR: Estimated Creatinine Clearance: 10.7 mL/min (A) (by C-G formula based on SCr of 7.6 mg/dL (H)). Liver Function Tests: Recent Labs  Lab 01/08/24 0426  AST 96*  ALT 126*  ALKPHOS 35*  BILITOT 0.4  PROT 5.2*  ALBUMIN 2.5*   No results for input(s): LIPASE, AMYLASE in the last 168 hours. No results for input(s): AMMONIA in the last 168 hours. Coagulation Profile: No results for input(s): INR, PROTIME in the last 168 hours. Cardiac Enzymes: Recent Labs  Lab 01/05/24 1206 01/06/24 1113 01/08/24 0426  CKTOTAL 27,024* 11,919* 5,164*   BNP (last 3 results) No results for input(s): PROBNP in the last 8760 hours. HbA1C: No results for input(s): HGBA1C in the last 72 hours. CBG: No results for input(s): GLUCAP in the last 168 hours. Lipid Profile: No results for input(s): CHOL, HDL, LDLCALC,  TRIG, CHOLHDL, LDLDIRECT in the last 72 hours. Thyroid Function Tests: No results for input(s): TSH, T4TOTAL, FREET4, T3FREE, THYROIDAB in the last 72 hours. Anemia Panel: No results for input(s): VITAMINB12, FOLATE, FERRITIN, TIBC, IRON, RETICCTPCT in the last 72 hours. Sepsis Labs: No results for input(s): PROCALCITON, LATICACIDVEN in the last 168 hours.  No results found for this or any previous visit (from the past 240 hours).       Radiology Studies: US  RENAL Result Date: 01/06/2024 CLINICAL DATA:  [id number] AKI (acute kidney injury) (HCC) [id number] EXAM: RENAL / URINARY TRACT ULTRASOUND COMPLETE COMPARISON:  February 25, 2017 FINDINGS: Right Kidney: Renal measurements: 13.2 x 6 x 6.2 cm = volume: 258 mL.Normal echogenicity. No mass. No hydronephrosis or nephrolithiasis. Left Kidney: Renal measurements: 13.5 x 6.4 x 6.2 cm = volume: 289 mL. Normal echogenicity. No mass. No hydronephrosis or nephrolithiasis. Bladder: Appears normal for degree of bladder distention. Other: None. IMPRESSION: No hydronephrosis or nephrolithiasis. Electronically Signed   By: Rogelia Carlean HERO.D.  On: 01/06/2024 10:20        Scheduled Meds:  Chlorhexidine  Gluconate Cloth  6 each Topical Daily   cholecalciferol   1,000 Units Oral Daily   gabapentin   100 mg Oral BID   heparin   5,000 Units Subcutaneous Q8H   influenza vac split trivalent PF  0.5 mL Intramuscular Tomorrow-1000   Continuous Infusions:  sodium chloride      sodium bicarbonate  150 mEq in dextrose  5 % 1,150 mL infusion 75 mL/hr at 01/07/24 0517          Consepcion Utt, MD [facility] 01/08/2024, 9:34 AM

## 2024-01-08 NOTE — Progress Notes (Signed)
 [facility] Progress Note    Assessment/ Plan:   AKI -secondary to rhabdomyolysis and crack/cocaine use. No obstruction on ultrasound. - Creatinine stable at 7.6, suspecting he is entering plateau phase.  Remains nonoliguric.  Continue with isotonic fluids: Increase rates to 100 cc/h given that his swelling is stable -no indications for renal replacement therapy at this junction as he is not exhibiting any uremic symptoms and/or worsening volume status -Avoid nephrotoxic medications including NSAIDs and iodinated intravenous contrast exposure unless the latter is absolutely indicated.  Preferred narcotic agents for pain control are hydromorphone, fentanyl , and methadone. Morphine should not be used. Avoid Baclofen and avoid oral sodium phosphate and magnesium citrate based laxatives / bowel preps. Continue strict Input and Output monitoring. Will monitor the patient closely with you and intervene or adjust therapy as indicated by changes in clinical status/labs   Rhabdomyolysis -secondary to fall and prolonged down time along with crack/cocaine use -fluids as above. CK trending down: peak ~27k, down to 5164 today -has been seen by ortho: no intervention warranted at this junction  Elevated LFTs -suspecting this is related to rhabdo, trend for now  LLE pain+edema -secondary to rhabdo -pain mgmt per primary service  Hyponatremia -secondary to AKI. Improving, up to 132 today  Anemia -transfuse prn for hgb <7. If trending downwards, consider checking iron stores as the next step  Hypokalemia -resolved, replete PRN  Crack/cocaine use -UDS results pending -per primary  Will not be physically seen over the weekend but chart/labs will be monitored remotely by covering nephrologist. Please call covering nephrologist with any questions/concerns or if patient needs to be physically seen.  Ephriam Stank, MD [facility]   Subjective:   Patient seen and  examined bedside. No acute events overnight. He reports that his swelling has not worsened. Still with some pain. He reports that he is urinating a lot and trying to keep himself hydrated which I encouraged him to continue. He denies any chest pain, SOB. UOP: ~1.4L   Objective:   BP (!) 141/82 (BP Location: Right Arm)   Pulse 73   Temp 98.6 F (37 C) (Oral)   Resp 19   Ht 5' 10 (1.778 m)   Wt 84.8 kg   SpO2 100%   BMI 26.83 kg/m   Intake/Output Summary (Last 24 hours) at 01/08/2024 9072 Last data filed at 01/08/2024 0815 Gross per 24 hour  Intake 960 ml  Output 1800 ml  Net -840 ml   Weight change:   Physical Exam: Gen: NAD, sitting up at the edge of the bed CVS: RRR Resp: CTA B/L Abd: soft, NT/ND Ext: LLE edema (tense) Neuro: awake, alert  Imaging: US  RENAL Result Date: 01/06/2024 CLINICAL DATA:  [id number] AKI (acute kidney injury) (HCC) [id number] EXAM: RENAL / URINARY TRACT ULTRASOUND COMPLETE COMPARISON:  February 25, 2017 FINDINGS: Right Kidney: Renal measurements: 13.2 x 6 x 6.2 cm = volume: 258 mL.Normal echogenicity. No mass. No hydronephrosis or nephrolithiasis. Left Kidney: Renal measurements: 13.5 x 6.4 x 6.2 cm = volume: 289 mL. Normal echogenicity. No mass. No hydronephrosis or nephrolithiasis. Bladder: Appears normal for degree of bladder distention. Other: None. IMPRESSION: No hydronephrosis or nephrolithiasis. Electronically Signed   By: Rogelia Myers M.D.   On: 01/06/2024 10:20    Labs: BMET Recent Labs  Lab 01/05/24 1206 01/05/24 2033 01/06/24 0340 01/06/24 1113 01/07/24 0341 01/08/24 0426  NA 126* 127* 127* 130* 129* 132*  K 3.9 3.4* 3.4* 3.2* 3.5 3.7  CL 90* 93*  94* 94* 95* 95*  CO2 17* 18* 20* 17* 20* 22  GLUCOSE 108* 163* 111* 142* 104* 128*  BUN 88* 86* 92* 93* 91* 91*  CREATININE 7.67* 7.50* 7.44* 8.06* 7.67* 7.60*  CALCIUM  7.4* 6.9* 6.8* 7.0* 7.2* 7.5*   CBC Recent Labs  Lab 01/05/24 1206 01/06/24 0340  WBC 9.3 8.8  NEUTROABS 7.4  --    HGB 12.6* 10.3*  HCT 35.6* 28.6*  MCV 83.4 81.9  PLT 238 234    Medications:     Chlorhexidine  Gluconate Cloth  6 each Topical Daily   heparin   5,000 Units Subcutaneous Q8H   influenza vac split trivalent PF  0.5 mL Intramuscular Tomorrow-1000      Ephriam Stank, MD [facility] 01/08/2024, 9:27 AM

## 2024-01-09 DIAGNOSIS — N179 Acute kidney failure, unspecified: Secondary | ICD-10-CM | POA: Diagnosis not present

## 2024-01-09 LAB — COMPREHENSIVE METABOLIC PANEL WITH GFR
ALT: 107 U/L — ABNORMAL HIGH (ref 0–44)
AST: 66 U/L — ABNORMAL HIGH (ref 15–41)
Albumin: 2.7 g/dL — ABNORMAL LOW (ref 3.5–5.0)
Alkaline Phosphatase: 34 U/L — ABNORMAL LOW (ref 38–126)
Anion gap: 16 — ABNORMAL HIGH (ref 5–15)
BUN: 87 mg/dL — ABNORMAL HIGH (ref 6–20)
CO2: 25 mmol/L (ref 22–32)
Calcium: 7.7 mg/dL — ABNORMAL LOW (ref 8.9–10.3)
Chloride: 92 mmol/L — ABNORMAL LOW (ref 98–111)
Creatinine, Ser: 7.36 mg/dL — ABNORMAL HIGH (ref 0.61–1.24)
GFR, Estimated: 8 mL/min — ABNORMAL LOW (ref >60)
Glucose, Bld: 130 mg/dL — ABNORMAL HIGH (ref 70–99)
Potassium: 3.7 mmol/L (ref 3.5–5.1)
Sodium: 133 mmol/L — ABNORMAL LOW (ref 135–145)
Total Bilirubin: 0.5 mg/dL (ref 0.0–1.2)
Total Protein: 5.5 g/dL — ABNORMAL LOW (ref 6.5–8.1)

## 2024-01-09 LAB — CK: Total CK: 2828 U/L — ABNORMAL HIGH (ref 49–397)

## 2024-01-09 LAB — CBC WITH DIFFERENTIAL/PLATELET
Abs Immature Granulocytes: 0.15 K/uL — ABNORMAL HIGH (ref 0.00–0.07)
Basophils Absolute: 0.1 K/uL (ref 0.0–0.1)
Basophils Relative: 1 %
Eosinophils Absolute: 0.3 K/uL (ref 0.0–0.5)
Eosinophils Relative: 3 %
HCT: 31 % — ABNORMAL LOW (ref 39.0–52.0)
Hemoglobin: 10.8 g/dL — ABNORMAL LOW (ref 13.0–17.0)
Immature Granulocytes: 2 %
Lymphocytes Relative: 15 %
Lymphs Abs: 1.5 K/uL (ref 0.7–4.0)
MCH: 29.4 pg (ref 26.0–34.0)
MCHC: 34.8 g/dL (ref 30.0–36.0)
MCV: 84.5 fL (ref 80.0–100.0)
Monocytes Absolute: 0.8 K/uL (ref 0.1–1.0)
Monocytes Relative: 8 %
Neutro Abs: 7 K/uL (ref 1.7–7.7)
Neutrophils Relative %: 71 %
Platelets: 339 K/uL (ref 150–400)
RBC: 3.67 MIL/uL — ABNORMAL LOW (ref 4.22–5.81)
RDW: 13.5 % (ref 11.5–15.5)
WBC: 9.8 K/uL (ref 4.0–10.5)
nRBC: 0 % (ref 0.0–0.2)

## 2024-01-09 NOTE — Progress Notes (Addendum)
@  9549 Provider on-call  DARIN Cone, NP) paged regarding pt's endorsement that he intends to leave AMA this morning around 0700. Pt cordial in his conveyance of this statement and completely oriented to his situation. Pt educated on importance of continuance of hospitalization due to his AKI but cites issues at home as his reason for desiring to leave.  No response from above provider. Dr. Adefeso paged with above information @0520 . Provider responded and endorsed to ask pt to wait until Day Team arrives in hopes they would discharge him. Pt updated but still endorses he needs to leave at 0700. Pt also requesting PRN Percocet for LLE pain he's had since admission. This RN explained that he could have his PRN APAP but due to his anticipated soon departure and our subsequent inability to monitor him, that we could not at this time offer Percocet. Pt understanding of rationale but declined APAP.   Addendum: Dr. Manfred came to bedside @0645  to further educate pt on importance of remaining in hospital. Despite education, pt adamant about leaving hospital. Pt reviewed and signed AMA form, PIV removed, and all belongings returned to pt. Pt endorses his sister is on her way to pick him up. Day RN, Sherrell, updated on pt situation and pt reminded to let us  know specifically when he departs for documentation. Pt appreciative of care received.

## 2024-01-09 NOTE — Discharge Summary (Signed)
 Patient left AMA just after 7 AM on 01/09/2024 despite persuasion to the contrary by overnight nocturnist Dr. Manfred and nursing staff. - Dr. Manfred had a face-to-face conversation with patient around 6:45 AM, patient still insisted on leaving AMA -Please see documentation from RN Marthew York from 01/09/2024 at 7:10 AM --Please see documentation from Dr. Rosalynn total differential dated 01/09/2024 at 7:12 AM - -Please see documentation from RN Etha Edison dated 01/09/2024 at 7:42 AM - Patient was neither seen nor evaluated by this writer as patient already left AMA prior to being able to physically see patient -Patient signed AMA papers overnight prior to beginning of my shift, per report from staff patient's sister picked him up just after 7 AM this morning. - Patient left AMA without being seen or evaluated by this writer -Please see full progress note from attending physician on 01/08/2024 with Dr. Derryl Duval -Please see progress note from nephrologist on 01/08/2024 Dr. Ephriam Stank - This is a No charge note  Gene Carwin, MD

## 2024-01-09 NOTE — Progress Notes (Addendum)
 Progress Note  RN paged due to patient's intent to leave AMA this morning around 7 AM.  Patient's chart was reviewed and was noted that he had AKI due to rhabdomyolysis and also had an anion gap that has improved to 16 from 19. CK was 27,024 on admission, but this has improved to 5,164. Patient was also being followed by a nephrologist. He was advised to wait to complete this treatment considering the current status of his kidney with BUN at 87 and creatinine of 7.36 with an eGFR of 8.  Implication of worsening effect of kidney status which may result in having to go on dialysis was explained to patient.  However, patient insists on leaving AMA.   Physical Exam  BP (!) 158/84 (BP Location: Left Arm)   Pulse 72   Temp 98.3 F (36.8 C) (Oral)   Resp 20   Ht 5' 10 (1.778 m)   Wt 84.8 kg   SpO2 99%   BMI 26.83 kg/m    Gen:- Awake Alert, and oriented x 3 HEENT:- [place].AT, No sclera icterus Neck-Supple Neck,No JVD,.  Lungs-  CTAB  CV- S1, S2 normal Abd-  +ve B.Sounds, Abd Soft, No tenderness,    Extremity/Skin:- No  edema,   warm and dry Psych-affect is appropriate, oriented x3 Neuro-no new focal deficits, no tremors  Assessment and plan Acute kidney injury Rhabdomyolysis Anion gap metabolic acidosis He was advised to return to ED for any worsening symptoms or if he changes his mind to complete his treatment.  Total time:  22 minutes This includes time reviewing the chart including progress notes, labs, EKGs, taking medical decisions, ordering labs and documenting findings.  Please refer to admission H&P, progress notes, consult notes regarding details and treatment of this patient.

## 2024-01-09 NOTE — Progress Notes (Signed)
 Advised by third shift RN that patient has already signed AMA paper and is waiting for sister to bring tennis shoes and walker. IV removed by previous shift. Patient left with unsteady gait and front wheel walker, escorted by his sister. Dr. Rendall notified.

## 2024-01-11 LAB — URINE DRUGS OF ABUSE SCREEN W ALC, ROUTINE (REF LAB)
Amphetamines, Urine: NEGATIVE ng/mL
Barbiturate, Ur: NEGATIVE ng/mL
Benzodiazepine Quant, Ur: NEGATIVE ng/mL
Cannabinoid Quant, Ur: NEGATIVE ng/mL
Creatinine, Urine: 32.3 mg/dL (ref 20.0–300.0)
Ethanol U, Quan: NEGATIVE %
Methadone Screen, Urine: NEGATIVE ng/mL
Nitrite Urine, Quantitative: NEGATIVE ug/mL
OPIATE SCREEN URINE: NEGATIVE ng/mL
Phencyclidine, Ur: NEGATIVE ng/mL
Propoxyphene, Urine: NEGATIVE ng/mL
pH, Urine: 5.7 (ref 4.5–8.9)

## 2024-01-11 LAB — COCAINE CONF, UR
Benzoylecgonine GC/MS Conf: >5000 ng/mL
Cocaine Metab Quant, Ur: POSITIVE — AB

## 2024-01-25 ENCOUNTER — Emergency Department (HOSPITAL_COMMUNITY)
Admission: EM | Admit: 2024-01-25 | Discharge: 2024-01-25 | Disposition: A | Discharge status: Home / Self Care | Payer: MEDICAID | Attending: Emergency Medicine | Admitting: Emergency Medicine

## 2024-01-25 ENCOUNTER — Encounter (HOSPITAL_COMMUNITY): Payer: Self-pay | Admitting: Emergency Medicine

## 2024-01-25 ENCOUNTER — Other Ambulatory Visit: Payer: Self-pay

## 2024-01-25 DIAGNOSIS — M25552 Pain in left hip: Secondary | ICD-10-CM | POA: Insufficient documentation

## 2024-01-25 DIAGNOSIS — W19XXXA Unspecified fall, initial encounter: Secondary | ICD-10-CM | POA: Diagnosis not present

## 2024-01-25 MED ORDER — HYDROCODONE-ACETAMINOPHEN 5-325 MG PO TABS
2.0000 | ORAL_TABLET | Freq: Once | ORAL | Status: AC
  Administered 2024-01-25: 2 via ORAL
  Filled 2024-01-25: qty 2

## 2024-01-25 MED ORDER — HYDROCODONE-ACETAMINOPHEN 5-325 MG PO TABS: 1.0000 | ORAL_TABLET | Freq: Four times a day (QID) | ORAL | 0 refills | Status: AC | PRN

## 2024-01-25 NOTE — ED Triage Notes (Signed)
 Pt reports continued left hip pain.  He is able to ambulate w/ the use of a cane, however he is very slow and gets tired easily.  The problem is the pain gets really bad at night when he tries to rest.

## 2024-01-25 NOTE — ED Provider Notes (Signed)
 [place] EMERGENCY DEPARTMENT AT [facility] Provider Note   CSN: [id number] Arrival date & time: 01/25/24  0209     Patient presents with: Hip Pain   TAMERON LAMA is a 58 y.o. male.   Patient is a 58 year old male with a recent admission after a fall.  He apparently injured his left hip and had findings of rhabdomyolysis in his blood work and acute renal failure.  He was admitted for IV hydration and trending of his CK.  While he was in the hospital, he also underwent CT scan and MRI of the hip, both of which were negative for fracture.  Patient ultimately signed out AMA.  Since returning home, he has had ongoing pain in his left hip that seems to be worse at night when he attempts to lie down to sleep.  He denies any new injury or trauma.  No weakness or numbness.       Prior to Admission medications   Medication Sig Start Date End Date Taking? Authorizing Provider  cholecalciferol  (VITAMIN D ) 1000 units tablet Take 1,000 Units by mouth daily.    [provider]  methadone (DOLOPHINE) 10 MG/5ML solution Take 40 mg by mouth daily. 11/08/15   [provider]    Allergies: Penicillins, Gramineae pollens, and Pollen extract    Review of Systems  All other systems reviewed and are negative.   Updated Vital Signs BP (!) 150/90 (BP Location: Right Arm)   Pulse 80   Temp 98.2 F (36.8 C) (Oral)   Resp 18   Ht 5' 10 (1.778 m)   Wt 84.8 kg   SpO2 97%   BMI 26.82 kg/m   Physical Exam Vitals and nursing note reviewed.  Constitutional:      Appearance: Normal appearance.  HENT:     Head: Normocephalic.  Pulmonary:     Effort: Pulmonary effort is normal.  Musculoskeletal:     Comments: The left hip is grossly normal in appearance.  There is some tenderness with palpation and range of motion.  Motor and sensation are intact to the foot.  DP pulses are easily palpable.  Skin:    General: Skin is warm and dry.  Neurological:     Mental Status:  He is alert and oriented to person, place, and time.     (all labs ordered are listed, but only abnormal results are displayed) Labs Reviewed - No data to display  EKG: None  Radiology: No results found.   Procedures   Medications Ordered in the ED  HYDROcodone-acetaminophen  (NORCO/VICODIN) 5-325 MG per tablet 2 tablet (has no administration in time range)                                    Medical Decision Making Risk Prescription drug management.   Patient presenting here with complaints of left hip pain.  He fell 2 weeks ago and was recently admitted to the hospital after he was found to be in rhabdomyolysis and acute renal failure.  He presents here today requesting pain medication for his hip.  He denies any new injury or trauma.  Pain is worse when he attempts to lie down and is having difficulty sleeping at night.  Patient will be prescribed a small quantity of hydrocodone and I will advise him to follow-up with primary doctor if symptoms persist.     Final diagnoses:  None  ED Discharge Orders     None          Geroldine Berg, MD 01/25/24 [phone]

## 2024-01-25 NOTE — Discharge Instructions (Signed)
 Begin taking hydrocodone as prescribed as needed for pain.  Follow-up with your primary doctor as scheduled.
